# Patient Record
Sex: Female | Born: 1970 | Race: Black or African American | Hispanic: No | Marital: Single | Smoking: Former smoker
Health system: Southern US, Community
[De-identification: ages and names within clinical notes are randomized; demographics above are authoritative.]

## PROBLEM LIST (undated history)

## (undated) DIAGNOSIS — D649 Anemia, unspecified: Secondary | ICD-10-CM

## (undated) DIAGNOSIS — R011 Cardiac murmur, unspecified: Secondary | ICD-10-CM

## (undated) DIAGNOSIS — F32A Depression, unspecified: Secondary | ICD-10-CM

## (undated) DIAGNOSIS — M5481 Occipital neuralgia: Secondary | ICD-10-CM

## (undated) DIAGNOSIS — F419 Anxiety disorder, unspecified: Secondary | ICD-10-CM

## (undated) DIAGNOSIS — I10 Essential (primary) hypertension: Secondary | ICD-10-CM

## (undated) DIAGNOSIS — T7840XA Allergy, unspecified, initial encounter: Secondary | ICD-10-CM

## (undated) DIAGNOSIS — G709 Myoneural disorder, unspecified: Secondary | ICD-10-CM

## (undated) HISTORY — DX: Cardiac murmur, unspecified: R01.1

## (undated) HISTORY — DX: Anemia, unspecified: D64.9

## (undated) HISTORY — DX: Myoneural disorder, unspecified: G70.9

## (undated) HISTORY — PX: HERNIA REPAIR: SHX51

## (undated) HISTORY — PX: DILATION AND CURETTAGE OF UTERUS: SHX78

## (undated) HISTORY — DX: Allergy, unspecified, initial encounter: T78.40XA

## (undated) HISTORY — DX: Depression, unspecified: F32.A

## (undated) HISTORY — PX: BRAIN SURGERY: SHX531

---

## 2011-10-19 LAB — PROTIME-INR: INR: 1

## 2011-10-19 LAB — URINALYSIS, COMPLETE
Bacteria: NONE SEEN
Bilirubin,UR: NEGATIVE
Blood: NEGATIVE
Glucose,UR: NEGATIVE mg/dL (ref 0–75)
Ketone: NEGATIVE
Leukocyte Esterase: NEGATIVE
Nitrite: NEGATIVE
Ph: 6 (ref 4.5–8.0)
Protein: NEGATIVE
RBC,UR: <1 /HPF (ref 0–5)
Specific Gravity: 1.015 (ref 1.003–1.030)

## 2011-10-19 LAB — COMPREHENSIVE METABOLIC PANEL
Albumin: 4.6 g/dL (ref 3.4–5.0)
Alkaline Phosphatase: 49 U/L — ABNORMAL LOW (ref 50–136)
Anion Gap: 8 (ref 7–16)
BUN: 11 mg/dL (ref 7–18)
Calcium, Total: 9.2 mg/dL (ref 8.5–10.1)
Chloride: 104 mmol/L (ref 98–107)
EGFR (African American): >60
Glucose: 82 mg/dL (ref 65–99)
Potassium: 4.1 mmol/L (ref 3.5–5.1)
SGOT(AST): 17 U/L (ref 15–37)
Total Protein: 8.4 g/dL — ABNORMAL HIGH (ref 6.4–8.2)

## 2011-10-19 LAB — CBC
HCT: 37.1 % (ref 35.0–47.0)
HGB: 11.9 g/dL — ABNORMAL LOW (ref 12.0–16.0)
MCHC: 32.1 g/dL (ref 32.0–36.0)
MCV: 89 fL (ref 80–100)
Platelet: 278 10*3/uL (ref 150–440)
RBC: 4.17 10*6/uL (ref 3.80–5.20)
WBC: 5.8 10*3/uL (ref 3.6–11.0)

## 2011-10-20 ENCOUNTER — Observation Stay: Payer: Self-pay | Admitting: Surgery

## 2014-10-08 ENCOUNTER — Emergency Department: Payer: Self-pay | Admitting: Emergency Medicine

## 2015-01-27 NOTE — H&P (Signed)
PATIENT NAME:  Lisa Hurst, Lisa Hurst MR#:  [id number] DATE OF BIRTH:  1971-08-23  DATE OF ADMISSION: 10/19/2011  HISTORY OF PRESENT ILLNESS: Ms. Gibler is a 44 year old black female who has a known umbilical hernia that has been painful for the last couple of weeks but she noticed swelling and more severe pain at her umbilical hernia site 36 hours ago. Since that time her hernia has remained swollen and tender and unchanged. She has continued to eat normally and she had a normal bowel movement today and continues to pass flatus. She has not had any nausea, vomiting, or fever.   PAST MEDICAL HISTORY: No medical illnesses.   ALLERGIES: Azithromycin and apples both of which cause hives. Shellfish, which causes anaphylaxis.   MEDICATIONS: None.   REVIEW OF SYSTEMS: Negative for 10 systems except as mentioned in the history of present illness. The most pertinent negatives are lack of abdominal distention, nausea, vomiting, obstipation.   FAMILY HISTORY: Noncontributory.   SOCIAL HISTORY: The patient is single. She lives with her mother her sister and her child. She works at [employer] and does not smoke cigarettes or drink alcohol.   PHYSICAL EXAMINATION:  GENERAL: A pleasant, healthy, soft spoken young black female lying in the stretcher in the Emergency Room.   VITAL SIGNS: Height 5 feet 4 inches, weight 128 pounds, BMI 22.0. Temperature 98.3, pulse 75, respirations 20, blood pressure 143/93, oxygen saturation 100% on room air.   HEENT: Pupils are equally round and reactive to light. Extraocular movements intact. Sclerae nonicteric. Oropharynx clear. Mucous membranes moist.   NECK: Supple with no lymphadenopathy. The trachea is midline and there is no jugular venous distention.   HEART: Regular rate and rhythm with no murmurs or rubs.   LUNGS: Clear to auscultation with normal respiratory effort bilaterally.   ABDOMEN: Soft, flat, nondistended and nontender with no masses with  the exception of the umbilicus. At the umbilicus there is a 2 cm firm, irreducible mass with some surrounding erythema. This is quite tender.   EXTREMITIES: No edema with normal capillary refill bilaterally.   NEUROLOGIC: Cranial nerves II through XII, motor and sensation grossly intact.   PSYCHIATRIC: Alert and oriented x4. Appropriate affect.   LABORATORY, DIAGNOSTIC, AND RADIOLOGICAL DATA: White blood cell count 5.8, hemoglobin 11.9, hematocrit 37.1%, platelet count 278,000. Electrolytes normal. Lipase normal. Hepatic profile normal with the exception of an elevated total protein at 8.4. Urinalysis is negative. PT/INR is normal and urine pregnancy test is negative.   ASSESSMENT: Incarcerated umbilical hernia with no evidence of any intestinal involvement clinically.   PLAN: Admit to hospital for analgesia with incarcerated umbilical hernia repair.    ____________________________ Consuela Mimes, MD wfm:cms D: 10/19/2011 23:44:01 ET T: 10/20/2011 08:24:14 ET JOB#: [id number]  cc: Consuela Mimes, MD, <Dictator> Consuela Mimes MD ELECTRONICALLY SIGNED 10/21/2011 2:58

## 2015-01-27 NOTE — Op Note (Signed)
PATIENT NAME:  Lisa Hurst, Lisa Hurst MR#:  [id number] DATE OF BIRTH:  May 11, 1971  DATE OF PROCEDURE:  10/20/2011  PREOPERATIVE DIAGNOSIS: Incarcerated umbilical hernia.   POSTOPERATIVE DIAGNOSIS: Incarcerated umbilical hernia.   PROCEDURE PERFORMED: Repair of incarcerated umbilical hernia.   SURGEON: Lecretia Buczek A. Marina Gravel, MD    ASSISTANT: Alger Simons, CRT    TYPE OF ANESTHESIA: General.   INDICATIONS: The patient is a 44 year old female with a day and a half history of worsening abdominal pain centered around her umbilicus. She has a previous history of known umbilical hernia during her last pregnancy within the last two years. She has had no obstructive symptoms. Physical examination is consistent with an incarcerated small umbilical hernia exquisitely tender.   FINDINGS: Approximately 1 cm fascial defect. There was an incarcerated lobule of preperitoneal fat.   SPECIMENS: Hernia sac and contents.   ESTIMATED BLOOD LOSS: Minimal.   DESCRIPTION OF PROCEDURE: With the patient in the supine position, general anesthesia was induced. Her abdomen was widely prepped and draped with ChloraPrep solution followed by [place]. Time-out procedure was observed.   A transverse curvilinear infraumbilical skin incision was fashioned with a scalpel and carried down with electrocautery to the umbilical stalk. The umbilical stalk was encircled with blunt dissection. The hernia was mainly above the umbilicus. The umbilical dermis was detached from the hernia. The hernia sac was then resected off the fascial edges with cautery and submitted to pathology in formalin. The fascia was without any undue tension in its reapproximation in a transverse orientation in a pants-over-vest technique utilizing #0 Ethibond suture. The umbilical dermis was then reattached to the fascia with a figure-of-eight #3 0 Vicryl suture. The deep layer of the umbilical wound was then reapproximated utilizing interrupted 3-0 Vicryl sutures. Deep dermal  inverted 3-0 Vicryl sutures were placed followed by running 4-0 Vicryl subcuticular in the skin, Dermabond 4 x 4's, and tape. The patient tolerated the procedure well without immediate complication.  ____________________________ Jeannette How Marina Gravel, MD mab:drc D: 10/20/2011 10:59:05 ET T: 10/20/2011 12:40:01 ET JOB#: [id number]  cc: Elta Guadeloupe A. Marina Gravel, MD, <Dictator> Hortencia Conradi MD ELECTRONICALLY SIGNED 10/23/2011 11:17

## 2015-03-26 ENCOUNTER — Other Ambulatory Visit: Payer: Self-pay | Admitting: Obstetrics and Gynecology

## 2015-03-26 DIAGNOSIS — Z1231 Encounter for screening mammogram for malignant neoplasm of breast: Secondary | ICD-10-CM

## 2017-02-03 ENCOUNTER — Emergency Department
Admission: EM | Admit: 2017-02-03 | Discharge: 2017-02-03 | Disposition: A | Discharge status: Home / Self Care | Payer: Medicaid Other | Attending: Emergency Medicine | Admitting: Emergency Medicine

## 2017-02-03 ENCOUNTER — Encounter: Payer: Self-pay | Admitting: Emergency Medicine

## 2017-02-03 ENCOUNTER — Emergency Department: Payer: Medicaid Other

## 2017-02-03 DIAGNOSIS — R1031 Right lower quadrant pain: Secondary | ICD-10-CM

## 2017-02-03 DIAGNOSIS — R110 Nausea: Secondary | ICD-10-CM | POA: Diagnosis not present

## 2017-02-03 DIAGNOSIS — K229 Disease of esophagus, unspecified: Secondary | ICD-10-CM

## 2017-02-03 DIAGNOSIS — Z87891 Personal history of nicotine dependence: Secondary | ICD-10-CM | POA: Diagnosis not present

## 2017-02-03 DIAGNOSIS — K228 Other specified diseases of esophagus: Secondary | ICD-10-CM | POA: Insufficient documentation

## 2017-02-03 LAB — COMPREHENSIVE METABOLIC PANEL
ALBUMIN: 4.8 g/dL (ref 3.5–5.0)
ALT: 11 U/L — ABNORMAL LOW (ref 14–54)
ANION GAP: 6 (ref 5–15)
AST: 23 U/L (ref 15–41)
Alkaline Phosphatase: 40 U/L (ref 38–126)
BILIRUBIN TOTAL: 1 mg/dL (ref 0.3–1.2)
BUN: 11 mg/dL (ref 6–20)
CO2: 27 mmol/L (ref 22–32)
Calcium: 9.6 mg/dL (ref 8.9–10.3)
Chloride: 103 mmol/L (ref 101–111)
Creatinine, Ser: 0.99 mg/dL (ref 0.44–1.00)
GFR calc non Af Amer: >60 mL/min (ref >60)
GLUCOSE: 99 mg/dL (ref 65–99)
Potassium: 3.8 mmol/L (ref 3.5–5.1)
SODIUM: 136 mmol/L (ref 135–145)
TOTAL PROTEIN: 8.1 g/dL (ref 6.5–8.1)

## 2017-02-03 LAB — CBC
HEMATOCRIT: 39.4 % (ref 35.0–47.0)
HEMOGLOBIN: 13.1 g/dL (ref 12.0–16.0)
MCH: 29.5 pg (ref 26.0–34.0)
MCHC: 33.3 g/dL (ref 32.0–36.0)
MCV: 88.6 fL (ref 80.0–100.0)
Platelets: 277 10*3/uL (ref 150–440)
RBC: 4.45 MIL/uL (ref 3.80–5.20)
RDW: 13.5 % (ref 11.5–14.5)
WBC: 6.3 10*3/uL (ref 3.6–11.0)

## 2017-02-03 LAB — URINALYSIS, COMPLETE (UACMP) WITH MICROSCOPIC
BILIRUBIN URINE: NEGATIVE
GLUCOSE, UA: NEGATIVE mg/dL
HGB URINE DIPSTICK: NEGATIVE
KETONES UR: 5 mg/dL — AB
LEUKOCYTES UA: NEGATIVE
NITRITE: NEGATIVE
PH: 5 (ref 5.0–8.0)
PROTEIN: NEGATIVE mg/dL
Specific Gravity, Urine: 1.024 (ref 1.005–1.030)

## 2017-02-03 LAB — LIPASE, BLOOD: Lipase: 21 U/L (ref 11–51)

## 2017-02-03 LAB — POCT PREGNANCY, URINE: Preg Test, Ur: NEGATIVE

## 2017-02-03 MED ORDER — IOPAMIDOL (ISOVUE-300) INJECTION 61%
30.0000 mL | Freq: Once | INTRAVENOUS | Status: AC | PRN
Start: 2017-02-03 — End: 2017-02-03
  Administered 2017-02-03: 30 mL via ORAL

## 2017-02-03 MED ORDER — ONDANSETRON HCL 4 MG/2ML IJ SOLN
4.0000 mg | Freq: Once | INTRAMUSCULAR | Status: AC
  Administered 2017-02-03: 4 mg via INTRAVENOUS
  Filled 2017-02-03: qty 2

## 2017-02-03 MED ORDER — IOPAMIDOL (ISOVUE-300) INJECTION 61%
100.0000 mL | Freq: Once | INTRAVENOUS | Status: AC | PRN
  Administered 2017-02-03: 100 mL via INTRAVENOUS

## 2017-02-03 MED ORDER — SODIUM CHLORIDE 0.9 % IV BOLUS (SEPSIS)
1000.0000 mL | Freq: Once | INTRAVENOUS | Status: AC
  Administered 2017-02-03: 1000 mL via INTRAVENOUS

## 2017-02-03 MED ORDER — ONDANSETRON 4 MG PO TBDP: 4.0000 mg | ORAL_TABLET | Freq: Three times a day (TID) | ORAL | 0 refills | Status: DC | PRN

## 2017-02-03 NOTE — Discharge Instructions (Signed)
You may take Tylenol or Motrin for pain. Zofran as for nausea. Please take a bland diet, until you're able to tolerate and normal diet.  Today, there was an abnormal lesion found near your esophagus. It is unlikely to be worrisome, but will need to be readmitted. Please ask your regular doctor to order repeat testing.  Return to the emergency department if you develop severe pain, fever, inability to keep down fluids, or any other symptoms concerning to you.

## 2017-02-03 NOTE — ED Notes (Signed)
Pt continues to drink contrast. Pt aware to press call bell when finished. Pt up to bathroom. NAD at this time. Ambulatory without assistance.

## 2017-02-03 NOTE — ED Triage Notes (Signed)
R lower abd pain x 3 days, loss of appetitie, denies fevers.

## 2017-02-03 NOTE — ED Provider Notes (Signed)
[facility] Emergency Department Provider Note  ____________________________________________  Time seen: Approximately 10:58 AM  I have reviewed the triage vital signs and the nursing notes.   HISTORY  Chief Complaint Abdominal Pain   HPI Lisa Hurst is a 46 y.o. female with a history of abdominal hernia repair remotely presenting with right lower quadrant pain and nausea. The patient reports that over last 3 days, she has had of fairly constant dull ache in the right lower quadrant and occasionally sharp pains are the sharp pains are more likely to occur she's been sitting for a while and then stands up. Her pain is worse with walking. She has had associated nausea without vomiting. Last BM was 2 days ago and was normal. No fevers or chills. No change in vaginal discharge or dysuria.  SH: Sexual active with one female partner, uses condoms every time for contraception.   History reviewed. No pertinent past medical history.  There are no active problems to display for this patient.   Past Surgical History:  Procedure Laterality Date  . DILATION AND CURETTAGE OF UTERUS    . HERNIA REPAIR        Allergies Erythromycin  No family history on file.  Social History Social History  Substance Use Topics  . Smoking status: Former Research scientist (life sciences)  . Smokeless tobacco: Not on file  . Alcohol use Not on file    Review of Systems Constitutional: No fever/chills.No lightheadedness or syncope. Eyes: No visual changes. ENT: No sore throat. No congestion or rhinorrhea. Cardiovascular: Denies chest pain. Denies palpitations. Respiratory: Denies shortness of breath.  No cough. Gastrointestinal: Positive right lower quadrant abdominal pain.  Positive nausea, no vomiting.  No diarrhea.  No constipation. Genitourinary: Negative for dysuria. No urinary frequency. No change in vaginal discharge. Musculoskeletal: Negative for back pain. Skin: Negative for  rash. Neurological: Negative for headaches. No focal numbness, tingling or weakness.   10-point ROS otherwise negative.  ____________________________________________   PHYSICAL EXAM:  VITAL SIGNS: ED Triage Vitals  Enc Vitals Group     BP 02/03/17 0938 (!) 127/93     Pulse Rate 02/03/17 0938 96     Resp 02/03/17 0938 18     Temp 02/03/17 0938 97.9 F (36.6 C)     Temp Source 02/03/17 0938 Oral     SpO2 02/03/17 0938 100 %     Weight 02/03/17 0939 138 lb (62.6 kg)     Height 02/03/17 0939 5\' 4"  (1.626 m)     Head Circumference --      Peak Flow --      Pain Score 02/03/17 0938 5     Pain Loc --      Pain Edu? --      Excl. in [place]? --     Constitutional: Alert and oriented. Well appearing and in no acute distress. Answers questions appropriately. Eyes: Conjunctivae are normal.  EOMI. No scleral icterus. Head: Atraumatic. Nose: No congestion/rhinnorhea. Mouth/Throat: Mucous membranes are moist.  Neck: No stridor.  Supple.   Cardiovascular: Normal rate, regular rhythm. No murmurs, rubs or gallops.  Respiratory: Normal respiratory effort.  No accessory muscle use or retractions. Lungs CTAB.  No wheezes, rales or ronchi. Gastrointestinal: Soft, and nondistended.  Significant tenderness to palpation in the lateral superior portion of the right lower quadrant. No guarding or rebound.  No peritoneal signs. Genitourinary: Deferred as the patient is not having lower pelvic pain or vaginal complaints today. Musculoskeletal: No LE edema.  Neurologic:  A&Ox3.  Speech is clear.  Face and smile are symmetric.  EOMI.  Moves all extremities well. Skin:  Skin is warm, dry and intact. No rash noted. Psychiatric: Mood and affect are normal. Speech and behavior are normal.  Normal judgement.  ____________________________________________   LABS (all labs ordered are listed, but only abnormal results are displayed)  Labs Reviewed  COMPREHENSIVE METABOLIC PANEL - Abnormal; Notable for the  following:       Result Value   ALT 11 (*)    All other components within normal limits  URINALYSIS, COMPLETE (UACMP) WITH MICROSCOPIC - Abnormal; Notable for the following:    Color, Urine YELLOW (*)    APPearance HAZY (*)    Ketones, ur 5 (*)    Bacteria, UA RARE (*)    Squamous Epithelial / LPF 0-5 (*)    All other components within normal limits  LIPASE, BLOOD  CBC  POC URINE PREG, ED  POCT PREGNANCY, URINE   ____________________________________________  EKG  Not indicated ____________________________________________  RADIOLOGY  Ct Abdomen Pelvis W Contrast  Result Date: 02/03/2017 CLINICAL DATA:  RIGHT-sided abdominal pain. Nausea. Lack of appetite. EXAM: CT ABDOMEN AND PELVIS WITH CONTRAST TECHNIQUE: Multidetector CT imaging of the abdomen and pelvis was performed using the standard protocol following bolus administration of intravenous contrast. CONTRAST:  182mL ISOVUE-300 IOPAMIDOL (ISOVUE-300) INJECTION 61% COMPARISON:  None. FINDINGS: Lower chest: Lung bases are clear. Hepatobiliary: Periportal edema is present. No focal hepatic lesion. Portal veins are patent. No biliary duct dilatation. Gallbladder collapsed. Common bile duct normal caliber. Pancreas: Pancreas is normal. No ductal dilatation. No pancreatic inflammation. Spleen: Normal spleen Adrenals/urinary tract: Adrenal glands and kidneys are normal. The ureters and bladder normal. Stomach/Bowel: Stomach, small bowel, appendix, and cecum are normal. The colon and rectosigmoid colon are normal. Vascular/Lymphatic: Abdominal aorta is normal caliber. There is no retroperitoneal or periportal lymphadenopathy. No pelvic lymphadenopathy. Reproductive: Uterus and ovaries normal. Other: Rounded soft tissue density positioned along the distal esophagus and anterior to the aorta measures 32 x 32 mm. Musculoskeletal: No aggressive osseous lesion. IMPRESSION: 1. Periportal edema in the liver could indicate volume overload from IV  hydration. 2. Normal appendix. 3. No obstructive uropathy. 4. No abnormality of the bowel. 5. Uterus and ovaries appear normal. 6. Rounded lesion along the LEFT aspect of the distal esophagus is indeterminate. This may represent a bronchogenic cyst or enteric duplication cyst. Lesion does not have worrisome characteristics however recommend follow-up nonemergent MRI or CT of the chest for further evaluation. Electronically Signed   By: Suzy Bouchard M.D.   On: 02/03/2017 13:10    ____________________________________________   PROCEDURES  Procedure(s) performed: None  Procedures  Critical Care performed: No ____________________________________________   INITIAL IMPRESSION / ASSESSMENT AND PLAN / ED COURSE  Pertinent labs & imaging results that were available during my care of the patient were reviewed by me and considered in my medical decision making (see chart for details).  46 y.o. female with a history of remote abdominal wall hernia repair presenting with right lower quadrant pain with nausea. Overall, the patient is well-appearing, afebrile and has stable vital signs. However, her abdominal examination is concerning for tenderness in the right lower quadrant. We'll get a CT scan to evaluate for appendicitis. If this is negative, I'll reevaluate her and consider ovarian pathology although ovarian torsion is lower on the differential. Plan reevaluation for final disposition.  ----------------------------------------- 1:53 PM on 02/03/2017 -----------------------------------------  The patient's workup in the emergency department has been reassuring. She  is not pregnant, and her laboratory studies are reassuring. She does not have a UTI. CT scan does not show appendicitis or any other acute causes for the patient's symptoms. There is an esophageal lesion on her CT scan that will need outpatient reevaluation, and I have let the patient know about this finding. At this time, the patient  is safe for discharge. Return precautions as well as follow-up instructions were discussed.  ____________________________________________  FINAL CLINICAL IMPRESSION(S) / ED DIAGNOSES  Final diagnoses:  Esophageal lesion  Right lower quadrant pain  Nausea without vomiting         NEW MEDICATIONS STARTED DURING THIS VISIT:  New Prescriptions   ONDANSETRON (ZOFRAN ODT) 4 MG DISINTEGRATING TABLET    Take 1 tablet (4 mg total) by mouth every 8 (eight) hours as needed for nausea or vomiting.      Eula Listen, MD 02/03/17 1354

## 2017-02-03 NOTE — ED Notes (Signed)
Pt has finished oral contrast. RN called CT.

## 2017-02-03 NOTE — ED Notes (Signed)
Pt returned from CT. NAD noted. Pt resting in bed at this time.

## 2017-02-03 NOTE — ED Notes (Signed)
Pt states Sunday she developed right sided abd pain with nausea and lack of appetite, states when pain began she became very sweaty, last BM 4/30, denies any problems with urination, denies any vaginal discharge, awake and alert in no acute distress

## 2017-02-03 NOTE — ED Notes (Signed)
Pt resting in bed, resp even and unlabored 

## 2017-02-03 NOTE — ED Notes (Signed)
Patient transported to CT 

## 2018-04-20 ENCOUNTER — Emergency Department: Payer: Medicaid Other

## 2018-04-20 ENCOUNTER — Emergency Department
Admission: EM | Admit: 2018-04-20 | Discharge: 2018-04-20 | Disposition: A | Discharge status: Home / Self Care | Payer: Medicaid Other | Attending: Emergency Medicine | Admitting: Emergency Medicine

## 2018-04-20 ENCOUNTER — Encounter: Payer: Self-pay | Admitting: Emergency Medicine

## 2018-04-20 DIAGNOSIS — Z87891 Personal history of nicotine dependence: Secondary | ICD-10-CM | POA: Diagnosis not present

## 2018-04-20 DIAGNOSIS — J384 Edema of larynx: Secondary | ICD-10-CM | POA: Diagnosis not present

## 2018-04-20 DIAGNOSIS — J040 Acute laryngitis: Secondary | ICD-10-CM | POA: Diagnosis not present

## 2018-04-20 DIAGNOSIS — E049 Nontoxic goiter, unspecified: Secondary | ICD-10-CM

## 2018-04-20 DIAGNOSIS — J029 Acute pharyngitis, unspecified: Secondary | ICD-10-CM | POA: Diagnosis present

## 2018-04-20 MED ORDER — METHYLPREDNISOLONE 4 MG PO TBPK: ORAL_TABLET | ORAL | 0 refills | Status: DC

## 2018-04-20 NOTE — ED Provider Notes (Signed)
[facility] Emergency Department Provider Note   ____________________________________________   First MD Initiated Contact with Patient 04/20/18 1246     (approximate)  I have reviewed the triage vital signs and the nursing notes.   HISTORY  Chief Complaint Sore Throat    HPI Lisa Hurst is a 47 y.o. female patient complain anterior throat pain which she describes "pressure".  Patient able to tolerate food and fluids.  Patient recently recovered from viral upper respiratory infection.  Patient rates her discomfort as a 4/10.  No palliative measure for complaint.  History reviewed. No pertinent past medical history.  There are no active problems to display for this patient.   Past Surgical History:  Procedure Laterality Date  . DILATION AND CURETTAGE OF UTERUS    . HERNIA REPAIR      Prior to Admission medications   Medication Sig Start Date End Date Taking? Authorizing Provider  methylPREDNISolone (MEDROL DOSEPAK) 4 MG TBPK tablet Take Tapered dose as directed 04/20/18   Sable Feil, PA-C  ondansetron (ZOFRAN ODT) 4 MG disintegrating tablet Take 1 tablet (4 mg total) by mouth every 8 (eight) hours as needed for nausea or vomiting. 02/03/17   Eula Listen, MD    Allergies Erythromycin  No family history on file.  Social History Social History   Tobacco Use  . Smoking status: Former Smoker  Substance Use Topics  . Alcohol use: Not on file  . Drug use: Not on file    Review of Systems Constitutional: No fever/chills Eyes: No visual changes. ENT: sore throat. Cardiovascular: Denies chest pain. Respiratory: Denies shortness of breath. Gastrointestinal: No abdominal pain.  No nausea, no vomiting.  No diarrhea.  No constipation. Genitourinary: Negative for dysuria. Musculoskeletal: Negative for back pain. Skin: Negative for rash. Neurological: Negative for headaches, focal weakness or  numbness.   ____________________________________________   PHYSICAL EXAM:  VITAL SIGNS: ED Triage Vitals  Enc Vitals Group     BP 04/20/18 1253 (!) 161/91     Pulse Rate 04/20/18 1253 76     Resp 04/20/18 1253 16     Temp 04/20/18 1253 98.7 F (37.1 C)     Temp Source 04/20/18 1253 Oral     SpO2 04/20/18 1253 100 %     Weight 04/20/18 1237 142 lb (64.4 kg)     Height 04/20/18 1237 5\' 4"  (1.626 m)     Head Circumference --      Peak Flow --      Pain Score 04/20/18 1237 4     Pain Loc --      Pain Edu? --      Excl. in [place]? --    Constitutional: Alert and oriented. Well appearing and in no acute distress. Eyes: Conjunctivae are normal. PERRL. EOMI. Head: Atraumatic. Nose: No congestion/rhinnorhea. Mouth/Throat: Mucous membranes are moist.  Oropharynx non-erythematous. Neck: No stridor.   Hematological/Lymphatic/Immunilogical: No cervical lymphadenopathy. Cardiovascular: Normal rate, regular rhythm. Grossly normal heart sounds.  Good peripheral circulation. Respiratory: Normal respiratory effort.  No retractions. Lungs CTAB. Neurologic:  Normal speech and language. No gross focal neurologic deficits are appreciated. No gait instability. Skin:  Skin is warm, dry and intact. No rash noted. Psychiatric: Mood and affect are normal. Speech and behavior are normal.  ____________________________________________   LABS (all labs ordered are listed, but only abnormal results are displayed)  Labs Reviewed - No data to display ____________________________________________  EKG   ____________________________________________  RADIOLOGY  ED MD interpretation:  Official radiology report(s): Dg Neck Soft Tissue  Result Date: 04/20/2018 CLINICAL DATA:  Pt reports had cold sxs and all got better except her throat still hurts. Denies fevers or difficulty swallowing but reports feels that something is pressing against her neck; States she feels like her neck is swollen; shielded  EXAM: NECK SOFT TISSUES - 1+ VIEW COMPARISON:  None. FINDINGS: There is no evidence of retropharyngeal soft tissue swelling or epiglottic enlargement. The cervical airway is unremarkable and no radio-opaque foreign body identified. Missing teeth and restorations. IMPRESSION: Negative. Electronically Signed   By: Lucrezia Europe M.D.   On: 04/20/2018 14:09    ____________________________________________   PROCEDURES  Procedure(s) performed: None  Procedures  Critical Care performed: No  ____________________________________________   INITIAL IMPRESSION / ASSESSMENT AND PLAN / ED COURSE  As part of my medical decision making, I reviewed the following data within the electronic MEDICAL RECORD NUMBER    Sore throat secondary to inflammation.  Discussed negative findings on soft tissue neck.  Patient given discharge care instruction advised take medication as directed.  Patient advised follow-up with the ENT clinic if no improvement in 1 week.      ____________________________________________   FINAL CLINICAL IMPRESSION(S) / ED DIAGNOSES  Final diagnoses:  Edema glottis  Inflammation of subglottic region     ED Discharge Orders        Ordered    methylPREDNISolone (MEDROL DOSEPAK) 4 MG TBPK tablet     04/20/18 1440       Note:  This document was prepared using Dragon voice recognition software and may include unintentional dictation errors.    Sable Feil, PA-C 04/20/18 1443    Delman Kitten, MD 04/20/18 [phone]

## 2018-04-20 NOTE — Discharge Instructions (Signed)
Take medication as directed.  If no improvement 1 week follow-up with the [facility] ENT clinic.

## 2018-04-20 NOTE — ED Triage Notes (Signed)
Pt reports had cold sxs and all got better except her throat still hurts. Denies fevers or difficulty swallowing but reports feels that something is pressing.

## 2018-09-13 ENCOUNTER — Other Ambulatory Visit: Payer: Self-pay

## 2018-09-13 ENCOUNTER — Emergency Department: Payer: Medicaid Other

## 2018-09-13 ENCOUNTER — Emergency Department
Admission: EM | Admit: 2018-09-13 | Discharge: 2018-09-13 | Disposition: A | Discharge status: Home / Self Care | Payer: Medicaid Other | Attending: Emergency Medicine | Admitting: Emergency Medicine

## 2018-09-13 ENCOUNTER — Encounter: Payer: Self-pay | Admitting: Emergency Medicine

## 2018-09-13 DIAGNOSIS — Z87891 Personal history of nicotine dependence: Secondary | ICD-10-CM | POA: Insufficient documentation

## 2018-09-13 DIAGNOSIS — S99922A Unspecified injury of left foot, initial encounter: Secondary | ICD-10-CM | POA: Diagnosis present

## 2018-09-13 DIAGNOSIS — S92522A Displaced fracture of medial phalanx of left lesser toe(s), initial encounter for closed fracture: Secondary | ICD-10-CM | POA: Insufficient documentation

## 2018-09-13 DIAGNOSIS — Y92019 Unspecified place in single-family (private) house as the place of occurrence of the external cause: Secondary | ICD-10-CM | POA: Insufficient documentation

## 2018-09-13 DIAGNOSIS — Y9301 Activity, walking, marching and hiking: Secondary | ICD-10-CM | POA: Insufficient documentation

## 2018-09-13 DIAGNOSIS — W231XXA Caught, crushed, jammed, or pinched between stationary objects, initial encounter: Secondary | ICD-10-CM | POA: Insufficient documentation

## 2018-09-13 DIAGNOSIS — Y998 Other external cause status: Secondary | ICD-10-CM | POA: Diagnosis not present

## 2018-09-13 MED ORDER — IBUPROFEN 600 MG PO TABS: 600.0000 mg | ORAL_TABLET | Freq: Four times a day (QID) | ORAL | 0 refills | Status: DC | PRN

## 2018-09-13 NOTE — ED Provider Notes (Signed)
[facility] Emergency Department Provider Note  ____________________________________________  Time seen: Approximately 7:08 PM  I have reviewed the triage vital signs and the nursing notes.   HISTORY  Chief Complaint Toe Pain    HPI Lisa Hurst is a 47 y.o. female presents emergency department for evaluation of left little toe pain after injury yesterday morning.  Patient states that her toe got jammed between her slipper and the wall.  She has had pain with walking on it all day today.  No additional injuries.  History reviewed. No pertinent past medical history.  There are no active problems to display for this patient.   Past Surgical History:  Procedure Laterality Date  . DILATION AND CURETTAGE OF UTERUS    . HERNIA REPAIR      Prior to Admission medications   Medication Sig Start Date End Date Taking? Authorizing Provider  ibuprofen (ADVIL,MOTRIN) 600 MG tablet Take 1 tablet (600 mg total) by mouth every 6 (six) hours as needed. 09/13/18   Laban Emperor, PA-C  methylPREDNISolone (MEDROL DOSEPAK) 4 MG TBPK tablet Take Tapered dose as directed 04/20/18   Sable Feil, PA-C  ondansetron (ZOFRAN ODT) 4 MG disintegrating tablet Take 1 tablet (4 mg total) by mouth every 8 (eight) hours as needed for nausea or vomiting. 02/03/17   Eula Listen, MD    Allergies Erythromycin  No family history on file.  Social History Social History   Tobacco Use  . Smoking status: Former Research scientist (life sciences)  . Smokeless tobacco: Never Used  Substance Use Topics  . Alcohol use: Not on file  . Drug use: Not on file     Review of Systems  Cardiovascular: No chest pain. Respiratory: No SOB. Gastrointestinal: No nausea, no vomiting.  Musculoskeletal: Positive for toe pain Skin: Negative for rash, abrasions, lacerations, ecchymosis. Neurological: Negative for headaches, numbness or tingling   ____________________________________________   PHYSICAL  EXAM:  VITAL SIGNS: ED Triage Vitals  Enc Vitals Group     BP 09/13/18 1757 (!) 150/97     Pulse Rate 09/13/18 1757 69     Resp 09/13/18 1757 20     Temp 09/13/18 1757 98.5 F (36.9 C)     Temp Source 09/13/18 1757 Oral     SpO2 09/13/18 1757 100 %     Weight 09/13/18 1753 138 lb (62.6 kg)     Height 09/13/18 1753 5\' 4"  (1.626 m)     Head Circumference --      Peak Flow --      Pain Score 09/13/18 1757 7     Pain Loc --      Pain Edu? --      Excl. in [place]? --      Constitutional: Alert and oriented. Well appearing and in no acute distress. Eyes: Conjunctivae are normal. PERRL. EOMI. Head: Atraumatic. ENT:      Ears:      Nose: No congestion/rhinnorhea.      Mouth/Throat: Mucous membranes are moist.  Neck: No stridor.   Cardiovascular: Normal rate, regular rhythm.  Good peripheral circulation. Respiratory: Normal respiratory effort without tachypnea or retractions. Lungs CTAB. Good air entry to the bases with no decreased or absent breath sounds. Musculoskeletal: Full range of motion to all extremities. No gross deformities appreciated.  Mild swelling and tenderness to palpation to left little toe. Neurologic:  Normal speech and language. No gross focal neurologic deficits are appreciated.  Skin:  Skin is warm, dry and intact. No rash noted. Psychiatric:  Mood and affect are normal. Speech and behavior are normal. Patient exhibits appropriate insight and judgement.   ____________________________________________   LABS (all labs ordered are listed, but only abnormal results are displayed)  Labs Reviewed - No data to display ____________________________________________  EKG   ____________________________________________  RADIOLOGY Robinette Haines, personally viewed and evaluated these images (plain radiographs) as part of my medical decision making, as well as reviewing the written report by the radiologist.  Dg Foot Complete Left  Result Date:  09/13/2018 CLINICAL DATA:  Hit foot against solid object EXAM: LEFT FOOT - COMPLETE 3+ VIEW COMPARISON:  None. FINDINGS: Frontal, oblique, and lateral views were obtained. There is a mildly comminuted fracture of the proximal aspect of the fifth middle phalanx with fracture fragments in near anatomic alignment. No other fractures are evident. No dislocation. Joint spaces appear normal. No erosive change. IMPRESSION: Mildly comminuted fracture proximal aspect fifth middle phalanx with fracture fragments in near anatomic alignment. No other fracture. No dislocation. No evident arthropathy. Electronically Signed   By: Lowella Grip III M.D.   On: 09/13/2018 18:50    ____________________________________________    PROCEDURES  Procedure(s) performed:    Procedures    Medications - No data to display   ____________________________________________   INITIAL IMPRESSION / ASSESSMENT AND PLAN / ED COURSE  Pertinent labs & imaging results that were available during my care of the patient were reviewed by me and considered in my medical decision making (see chart for details).  Review of the  [place]RS was performed in accordance of the [place] prior to dispensing any controlled drugs.     Patient presented emergency department for evaluation of toe injury yesterday.  Vital signs and exam are reassuring.  X-ray consistent with fracture.  Toes were buddy taped and postop shoe was given.  Patient does not want any crutches.  Patient will be discharged home with prescriptions for ibuprofen. Patient is to follow up with podiatry as directed. Patient is given ED precautions to return to the ED for any worsening or new symptoms.     ____________________________________________  FINAL CLINICAL IMPRESSION(S) / ED DIAGNOSES  Final diagnoses:  Closed displaced fracture of middle phalanx of lesser toe of left foot, initial encounter      NEW MEDICATIONS STARTED DURING THIS VISIT:  ED  Discharge Orders         Ordered    ibuprofen (ADVIL,MOTRIN) 600 MG tablet  Every 6 hours PRN     09/13/18 1918              This chart was dictated using voice recognition software/Dragon. Despite best efforts to proofread, errors can occur which can change the meaning. Any change was purely unintentional.    Laban Emperor, PA-C 09/13/18 2034    Carrie Mew, MD 09/13/18 [id number]

## 2018-09-13 NOTE — ED Triage Notes (Signed)
States she hit her left 5 th toe yesterday  Having increased pain this afternoon

## 2019-11-17 ENCOUNTER — Emergency Department: Payer: BC Managed Care – PPO

## 2019-11-17 ENCOUNTER — Encounter: Payer: Self-pay | Admitting: Radiology

## 2019-11-17 ENCOUNTER — Other Ambulatory Visit: Payer: Self-pay

## 2019-11-17 ENCOUNTER — Emergency Department
Admission: EM | Admit: 2019-11-17 | Discharge: 2019-11-17 | Disposition: A | Discharge status: Home / Self Care | Payer: BC Managed Care – PPO | Attending: Emergency Medicine | Admitting: Emergency Medicine

## 2019-11-17 DIAGNOSIS — R0602 Shortness of breath: Secondary | ICD-10-CM | POA: Diagnosis not present

## 2019-11-17 DIAGNOSIS — Z87891 Personal history of nicotine dependence: Secondary | ICD-10-CM | POA: Diagnosis not present

## 2019-11-17 DIAGNOSIS — Z20822 Contact with and (suspected) exposure to covid-19: Secondary | ICD-10-CM | POA: Insufficient documentation

## 2019-11-17 DIAGNOSIS — R002 Palpitations: Secondary | ICD-10-CM | POA: Insufficient documentation

## 2019-11-17 DIAGNOSIS — R0789 Other chest pain: Secondary | ICD-10-CM | POA: Diagnosis not present

## 2019-11-17 LAB — COMPREHENSIVE METABOLIC PANEL
ALT: 16 U/L (ref 0–44)
AST: 24 U/L (ref 15–41)
Albumin: 4.9 g/dL (ref 3.5–5.0)
Alkaline Phosphatase: 39 U/L (ref 38–126)
Anion gap: 11 (ref 5–15)
BUN: 19 mg/dL (ref 6–20)
CO2: 22 mmol/L (ref 22–32)
Calcium: 9.8 mg/dL (ref 8.9–10.3)
Chloride: 105 mmol/L (ref 98–111)
Creatinine, Ser: 0.97 mg/dL (ref 0.44–1.00)
GFR calc Af Amer: >60 mL/min (ref >60)
GFR calc non Af Amer: >60 mL/min (ref >60)
Glucose, Bld: 78 mg/dL (ref 70–99)
Potassium: 4.2 mmol/L (ref 3.5–5.1)
Sodium: 138 mmol/L (ref 135–145)
Total Bilirubin: 1.2 mg/dL (ref 0.3–1.2)
Total Protein: 8.4 g/dL — ABNORMAL HIGH (ref 6.5–8.1)

## 2019-11-17 LAB — CBC WITH DIFFERENTIAL/PLATELET
Abs Immature Granulocytes: 0.02 10*3/uL (ref 0.00–0.07)
Basophils Absolute: 0 10*3/uL (ref 0.0–0.1)
Basophils Relative: 1 %
Eosinophils Absolute: 0.1 10*3/uL (ref 0.0–0.5)
Eosinophils Relative: 1 %
HCT: 41.3 % (ref 36.0–46.0)
Hemoglobin: 13.2 g/dL (ref 12.0–15.0)
Immature Granulocytes: 0 %
Lymphocytes Relative: 26 %
Lymphs Abs: 1.5 10*3/uL (ref 0.7–4.0)
MCH: 28.7 pg (ref 26.0–34.0)
MCHC: 32 g/dL (ref 30.0–36.0)
MCV: 89.8 fL (ref 80.0–100.0)
Monocytes Absolute: 0.4 10*3/uL (ref 0.1–1.0)
Monocytes Relative: 6 %
Neutro Abs: 3.9 10*3/uL (ref 1.7–7.7)
Neutrophils Relative %: 66 %
Platelets: 265 10*3/uL (ref 150–400)
RBC: 4.6 MIL/uL (ref 3.87–5.11)
RDW: 12.4 % (ref 11.5–15.5)
WBC: 5.9 10*3/uL (ref 4.0–10.5)
nRBC: 0 % (ref 0.0–0.2)

## 2019-11-17 LAB — TSH: TSH: 0.92 u[IU]/mL (ref 0.350–4.500)

## 2019-11-17 LAB — TROPONIN I (HIGH SENSITIVITY): Troponin I (High Sensitivity): <2 ng/L (ref <18)

## 2019-11-17 LAB — T4, FREE: Free T4: 1.05 ng/dL (ref 0.61–1.12)

## 2019-11-17 LAB — FIBRIN DERIVATIVES D-DIMER (ARMC ONLY): Fibrin derivatives D-dimer (ARMC): 3336.86 ng/mL (FEU) — ABNORMAL HIGH (ref 0.00–499.00)

## 2019-11-17 MED ORDER — IOHEXOL 350 MG/ML SOLN
75.0000 mL | Freq: Once | INTRAVENOUS | Status: AC | PRN
  Administered 2019-11-17: 75 mL via INTRAVENOUS

## 2019-11-17 NOTE — ED Notes (Signed)
MD at bedside. 

## 2019-11-17 NOTE — ED Triage Notes (Signed)
Pt states that she started having chest pain 4 days ago, pt states that the pain has been intermittent then notices that she has become sob and her heart is racing, pt states that the pain feels as if something is pulling back at her left chest area. No distress noted at this time, pt reports driving herself

## 2019-11-17 NOTE — ED Notes (Signed)
Pt to CT at this time.

## 2019-11-17 NOTE — ED Provider Notes (Signed)
[facility] Emergency Department Provider Note  ____________________________________________  Time seen: Approximately 5:20 PM  I have reviewed the triage vital signs and the nursing notes.   HISTORY  Chief Complaint Chest Pain    HPI Lynsy Prodoehl is a 49 y.o. female with no significant past medical history who complains of chest pain for the past 4 days, intermittent, no aggravating or alleviating factors, last for a few minutes at a time.  Nonradiating.  Feels like squeezing.  Associated with shortness of breath and a feeling of heart racing without dizziness or syncope.  Not exertional, not pleuritic.      History reviewed. No pertinent past medical history.   There are no problems to display for this patient.    Past Surgical History:  Procedure Laterality Date  . DILATION AND CURETTAGE OF UTERUS    . HERNIA REPAIR       Prior to Admission medications   Medication Sig Start Date End Date Taking? Authorizing Provider  ibuprofen (ADVIL,MOTRIN) 600 MG tablet Take 1 tablet (600 mg total) by mouth every 6 (six) hours as needed. 09/13/18   Laban Emperor, PA-C  methylPREDNISolone (MEDROL DOSEPAK) 4 MG TBPK tablet Take Tapered dose as directed 04/20/18   Sable Feil, PA-C  ondansetron (ZOFRAN ODT) 4 MG disintegrating tablet Take 1 tablet (4 mg total) by mouth every 8 (eight) hours as needed for nausea or vomiting. 02/03/17   Eula Listen, MD     Allergies Erythromycin   No family history on file.  Social History Social History   Tobacco Use  . Smoking status: Former Research scientist (life sciences)  . Smokeless tobacco: Never Used  Substance Use Topics  . Alcohol use: Not on file  . Drug use: Not on file    Review of Systems  Constitutional:   No fever or chills.  ENT:   No sore throat. No rhinorrhea. Cardiovascular:   Positive as above chest pain without syncope. Respiratory:   Positive shortness of breath without cough. Gastrointestinal:    Negative for abdominal pain, vomiting and diarrhea.  Musculoskeletal:   Negative for focal pain or swelling All other systems reviewed and are negative except as documented above in ROS and HPI.  ____________________________________________   PHYSICAL EXAM:  VITAL SIGNS: ED Triage Vitals  Enc Vitals Group     BP 11/17/19 1037 138/90     Pulse Rate 11/17/19 1037 77     Resp 11/17/19 1037 18     Temp 11/17/19 1037 98.4 F (36.9 C)     Temp Source 11/17/19 1037 Oral     SpO2 11/17/19 1037 100 %     Weight 11/17/19 1038 138 lb (62.6 kg)     Height 11/17/19 1038 5\' 4"  (1.626 m)     Head Circumference --      Peak Flow --      Pain Score 11/17/19 1038 3     Pain Loc --      Pain Edu? --      Excl. in [place]? --     Vital signs reviewed, nursing assessments reviewed.   Constitutional:   Alert and oriented. Non-toxic appearance. Eyes:   Conjunctivae are normal. EOMI. PERRL. ENT      Head:   Normocephalic and atraumatic.      Nose:   Wearing a mask.      Mouth/Throat:   Wearing a mask.      Neck:   No meningismus. Full ROM. Hematological/Lymphatic/Immunilogical:   No cervical lymphadenopathy.  Cardiovascular:   RRR. Symmetric bilateral radial and DP pulses.  No murmurs. Cap refill less than 2 seconds. Respiratory:   Normal respiratory effort without tachypnea/retractions. Breath sounds are clear and equal bilaterally. No wheezes/rales/rhonchi. Gastrointestinal:   Soft and nontender. Non distended. There is no CVA tenderness.  No rebound, rigidity, or guarding. Musculoskeletal:   Normal range of motion in all extremities. No joint effusions.  No lower extremity tenderness.  No edema. Neurologic:   Normal speech and language.  Motor grossly intact. No acute focal neurologic deficits are appreciated.  Skin:    Skin is warm, dry and intact. No rash noted.  No petechiae, purpura, or bullae.  ____________________________________________    LABS (pertinent positives/negatives) (all  labs ordered are listed, but only abnormal results are displayed) Labs Reviewed  COMPREHENSIVE METABOLIC PANEL - Abnormal; Notable for the following components:      Result Value   Total Protein 8.4 (*)    All other components within normal limits  FIBRIN DERIVATIVES D-DIMER ([facility] ONLY) - Abnormal; Notable for the following components:   Fibrin derivatives D-dimer [facility]) [id number] (*)    All other components within normal limits  SARS CORONAVIRUS 2 (TAT 6-24 HRS)  CBC WITH DIFFERENTIAL/PLATELET  TSH  T4, FREE  POC URINE PREG, ED  TROPONIN I (HIGH SENSITIVITY)   ____________________________________________   EKG  Interpreted by me Normal sinus rhythm rate of 98, normal axis and intervals.  Normal QRS ST segments and T waves.  ____________________________________________    RADIOLOGY  DG Chest 2 View  Result Date: 11/17/2019 CLINICAL DATA:  Intermittent pulling sensation in her chest for the past 4 days. EXAM: CHEST - 2 VIEW COMPARISON:  None. FINDINGS: The heart size and mediastinal contours are within normal limits. Both lungs are clear. The visualized skeletal structures are unremarkable. IMPRESSION: No active cardiopulmonary disease. Electronically Signed   By: Titus Dubin M.D.   On: 11/17/2019 11:01   CT Angio Chest PE W and/or Wo Contrast  Result Date: 11/17/2019 CLINICAL DATA:  Chest pain. Positive D-dimer. Shortness of breath. EXAM: CT ANGIOGRAPHY CHEST WITH CONTRAST TECHNIQUE: Multidetector CT imaging of the chest was performed using the standard protocol during bolus administration of intravenous contrast. Multiplanar CT image reconstructions and MIPs were obtained to evaluate the vascular anatomy. CONTRAST:  53mL OMNIPAQUE IOHEXOL 350 MG/ML SOLN COMPARISON:  Chest x-ray dated 11/17/2019 and CT scan of the abdomen dated 02/03/2017 FINDINGS: Cardiovascular: Satisfactory opacification of the pulmonary arteries to the segmental level. No evidence of pulmonary embolism.  Normal heart size. No pericardial effusion. 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 No hilar or mediastinal adenopathy. Thyroid gland and esophagus are normal. Lungs/Pleura: Lungs are clear. No pleural effusion or pneumothorax. Upper Abdomen: Normal. Musculoskeletal: No chest wall abnormality. No acute or significant osseous findings. Review of the MIP images confirms the above findings. IMPRESSION: 1. No pulmonary emboli or other acute abnormalities. 2. 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 Electronically Signed   By: Lorriane Shire M.D.   On: 11/17/2019 15:53    ____________________________________________   PROCEDURES Procedures  ____________________________________________  DIFFERENTIAL DIAGNOSIS   GERD, pneumonia, pneumothorax, pulmonary embolism, dysrhythmia  CLINICAL IMPRESSION / ASSESSMENT AND PLAN / ED COURSE  Medications ordered in the ED:  Medications  iohexol (OMNIPAQUE) 350 MG/ML injection 75 mL (75 mLs Intravenous Contrast Given 11/17/19 1536)    Pertinent labs & imaging results that were available during my care of the patient were reviewed by me and considered in my medical decision making (see chart for details).  Dealva Matlick was evaluated in Emergency Department on 11/17/2019 for the symptoms described in the history of present illness. She was evaluated in the context of the global COVID-19 pandemic, which necessitated consideration that the patient might be at risk for infection with the SARS-CoV-2 virus that causes COVID-19. Institutional protocols and algorithms that pertain to the evaluation of patients at risk for COVID-19 are in a state of rapid  change based on information released by regulatory bodies including the CDC and federal and state organizations. These policies and algorithms were followed during the patient's care in the ED.   Patient presents with atypical chest pain, currently symptom-free.  With associated shortness of breath and palpitations, concerning for paroxysmal dysrhythmia, low suspicion for PE but would further risk stratify with D-dimer.  ----------------------------------------- 5:22 PM on 11/17/2019 -----------------------------------------  D-dimer result very high at 3300.  Other labs unremarkable, troponin completely negative, and with atypical symptoms and normal EKG, doubt that this needs to be repeated further chest x-ray is clear.  CT scan was obtained due to the D-dimer which is negative for PE or any other occult pulmonary pathology.  I will send a Covid swab in case these are atypical Covid symptoms causing an elevation of her D-dimer.  Plan for trial of famotidine over the weekend, follow-up with cardiology next week for further evaluation and consideration of Holter monitor given the palpitations.      ____________________________________________   FINAL CLINICAL IMPRESSION(S) / ED DIAGNOSES    Final diagnoses:  Atypical chest pain     ED Discharge Orders    None      Portions of this note were generated with dragon dictation software. Dictation errors may occur despite best attempts at proofreading.   Carrie Mew, MD 11/17/19 1723

## 2019-11-17 NOTE — Discharge Instructions (Addendum)
Your blood tests and CT scan of the chest today were all okay. Please Take famotidine two times daily over the weekend and follow up with cardiology next week (Call Monday for an appointment) for further evaluation of these symptoms.

## 2019-11-18 LAB — SARS CORONAVIRUS 2 (TAT 6-24 HRS): SARS Coronavirus 2: NEGATIVE

## 2019-11-29 DIAGNOSIS — R002 Palpitations: Secondary | ICD-10-CM | POA: Insufficient documentation

## 2019-11-29 DIAGNOSIS — R079 Chest pain, unspecified: Secondary | ICD-10-CM | POA: Insufficient documentation

## 2019-11-29 DIAGNOSIS — R0602 Shortness of breath: Secondary | ICD-10-CM | POA: Insufficient documentation

## 2020-03-15 ENCOUNTER — Emergency Department: Payer: BC Managed Care – PPO

## 2020-03-15 ENCOUNTER — Emergency Department
Admission: EM | Admit: 2020-03-15 | Discharge: 2020-03-15 | Disposition: A | Discharge status: Home / Self Care | Payer: BC Managed Care – PPO | Attending: Emergency Medicine | Admitting: Emergency Medicine

## 2020-03-15 ENCOUNTER — Other Ambulatory Visit: Payer: Self-pay

## 2020-03-15 ENCOUNTER — Encounter: Payer: Self-pay | Admitting: Emergency Medicine

## 2020-03-15 DIAGNOSIS — Z87891 Personal history of nicotine dependence: Secondary | ICD-10-CM | POA: Diagnosis not present

## 2020-03-15 DIAGNOSIS — J9801 Acute bronchospasm: Secondary | ICD-10-CM | POA: Diagnosis not present

## 2020-03-15 DIAGNOSIS — I10 Essential (primary) hypertension: Secondary | ICD-10-CM | POA: Insufficient documentation

## 2020-03-15 DIAGNOSIS — R05 Cough: Secondary | ICD-10-CM | POA: Diagnosis present

## 2020-03-15 HISTORY — DX: Essential (primary) hypertension: I10

## 2020-03-15 MED ORDER — HYDROCOD POLST-CPM POLST ER 10-8 MG/5ML PO SUER
5.0000 mL | Freq: Once | ORAL | Status: AC
  Administered 2020-03-15: 5 mL via ORAL
  Filled 2020-03-15: qty 5

## 2020-03-15 MED ORDER — BENZONATATE 100 MG PO CAPS: 200.0000 mg | ORAL_CAPSULE | Freq: Three times a day (TID) | ORAL | 0 refills | Status: DC | PRN

## 2020-03-15 MED ORDER — METHYLPREDNISOLONE 4 MG PO TBPK: ORAL_TABLET | ORAL | 0 refills | Status: DC

## 2020-03-15 MED ORDER — HYDROCOD POLST-CPM POLST ER 10-8 MG/5ML PO SUER: 5.0000 mL | Freq: Every evening | ORAL | 0 refills | Status: DC | PRN

## 2020-03-15 NOTE — Discharge Instructions (Addendum)
Follow discharge care instructions take medication as directed. °

## 2020-03-15 NOTE — ED Triage Notes (Signed)
Non prod cough for a week.  Patient has uncontrollable cough in triage.  Says it kept her up all night.  No fever

## 2020-03-15 NOTE — ED Provider Notes (Signed)
[facility] Emergency Department Provider Note   ____________________________________________   First MD Initiated Contact with Patient 03/15/20 [id number]     (approximate)  I have reviewed the triage vital signs and the nursing notes.   HISTORY  Chief Complaint Cough    HPI Lisa Hurst is a 49 y.o. female patient complain of uncontrollable nonproductive cough for 1 week.  Patient unable to sleep secondary to cough.  Patient stated mild wheezing after coughing spells.  Patient denies recent travel or known contact with COVID-19.  Patient denies fever/chills associated with complaint.  Patient denies nausea, vomiting, diarrhea.  No palliative measure for complaint.  Patient denies dyspnea or chest pain.         Past Medical History:  Diagnosis Date  . Hypertension     There are no problems to display for this patient.   Past Surgical History:  Procedure Laterality Date  . DILATION AND CURETTAGE OF UTERUS    . HERNIA REPAIR      Prior to Admission medications   Medication Sig Start Date End Date Taking? Authorizing Provider  benzonatate (TESSALON PERLES) 100 MG capsule Take 2 capsules (200 mg total) by mouth 3 (three) times daily as needed. 03/15/20 03/15/21  Sable Feil, PA-C  chlorpheniramine-HYDROcodone (TUSSIONEX PENNKINETIC ER) 10-8 MG/5ML SUER Take 5 mLs by mouth at bedtime as needed for cough. 03/15/20   Sable Feil, PA-C  methylPREDNISolone (MEDROL DOSEPAK) 4 MG TBPK tablet Take Tapered dose as directed 03/15/20   Sable Feil, PA-C    Allergies Erythromycin  No family history on file.  Social History Social History   Tobacco Use  . Smoking status: Former Research scientist (life sciences)  . Smokeless tobacco: Never Used  Substance Use Topics  . Alcohol use: Never  . Drug use: Not on file    Review of Systems Constitutional: No fever/chills Eyes: No visual changes. ENT: No sore throat. Cardiovascular: Denies chest pain. Respiratory: Denies  shortness of breath.  Nonproductive uncontrollable cough. Gastrointestinal: No abdominal pain.  No nausea, no vomiting.  No diarrhea.  No constipation. Genitourinary: Negative for dysuria. Musculoskeletal: Negative for back pain. Skin: Negative for rash. Neurological: Negative for headaches, focal weakness or numbness. Endocrine:  Hypertension Hematological/Lymphatic:  Allergic/Immunilogical: Erythromycin ____________________________________________   PHYSICAL EXAM:  VITAL SIGNS: ED Triage Vitals  Enc Vitals Group     BP 03/15/20 0802 (!) 162/75     Pulse Rate 03/15/20 0802 85     Resp 03/15/20 0802 18     Temp 03/15/20 0802 99.1 F (37.3 C)     Temp Source 03/15/20 0802 Oral     SpO2 03/15/20 0802 99 %     Weight 03/15/20 0807 138 lb (62.6 kg)     Height 03/15/20 0807 5\' 4"  (1.626 m)     Head Circumference --      Peak Flow --      Pain Score 03/15/20 0807 0     Pain Loc --      Pain Edu? --      Excl. in [place]? --    Constitutional: Alert and oriented. Well appearing and in no acute distress. Nose: No congestion/rhinnorhea. Mouth/Throat: Mucous membranes are moist.  Oropharynx non-erythematous. Neck: No stridor.  Cardiovascular: Normal rate, regular rhythm. Grossly normal heart sounds.  Good peripheral circulation.  Elevated blood pressure. Respiratory: Normal respiratory effort.  No retractions. Lungs CTAB. Skin:  Skin is warm, dry and intact. No rash noted. Psychiatric: Mood and affect are normal. Speech  and behavior are normal.  ____________________________________________   LABS (all labs ordered are listed, but only abnormal results are displayed)  Labs Reviewed - No data to display ____________________________________________  EKG   ____________________________________________  RADIOLOGY  ED MD interpretation:    Official radiology report(s): DG Chest Portable 1 View  Result Date: 03/15/2020 CLINICAL DATA:  Cough for 2-3 days. EXAM: PORTABLE CHEST 1  VIEW COMPARISON:  11/17/2019 FINDINGS: The heart size and mediastinal contours are within normal limits. Both lungs are clear. The visualized skeletal structures are unremarkable. IMPRESSION: No active disease. Electronically Signed   By: Kerby Moors M.D.   On: 03/15/2020 09:03    ____________________________________________   PROCEDURES  Procedure(s) performed (including Critical Care):  Procedures   ____________________________________________   INITIAL IMPRESSION / ASSESSMENT AND PLAN / ED COURSE  As part of my medical decision making, I reviewed the following data within the [place]     Patient presents with uncontrollable cough for 1 week.  Discussed negative chest x-ray findings with patient.  Patient complaint physical exam is consistent with bronchospasm.  Patient achieved moderate control with coughing status post Tussionex while waiting for x-ray results.  Patient given discharge care instruction.  Patient given a prescription for Tussionex, Tessalon Perles, and Medrol Dosepak.  Patient advised follow-up PCP.    Lisa Hurst was evaluated in Emergency Department on 03/15/2020 for the symptoms described in the history of present illness. She was evaluated in the context of the global COVID-19 pandemic, which necessitated consideration that the patient might be at risk for infection with the SARS-CoV-2 virus that causes COVID-19. Institutional protocols and algorithms that pertain to the evaluation of patients at risk for COVID-19 are in a state of rapid change based on information released by regulatory bodies including the CDC and federal and state organizations. These policies and algorithms were followed during the patient's care in the ED.       ____________________________________________   FINAL CLINICAL IMPRESSION(S) / ED DIAGNOSES  Final diagnoses:  Cough due to bronchospasm     ED Discharge Orders         Ordered     chlorpheniramine-HYDROcodone (TUSSIONEX PENNKINETIC ER) 10-8 MG/5ML SUER  At bedtime PRN     Discontinue  Reprint     03/15/20 0917    benzonatate (TESSALON PERLES) 100 MG capsule  3 times daily PRN     Discontinue  Reprint     03/15/20 0917    methylPREDNISolone (MEDROL DOSEPAK) 4 MG TBPK tablet     Discontinue  Reprint     03/15/20 0917           Note:  This document was prepared using Dragon voice recognition software and may include unintentional dictation errors.    Sable Feil, PA-C 03/15/20 [id number]    Harvest Dark, MD 03/15/20 1445

## 2020-03-15 NOTE — ED Notes (Signed)
See triage note  Presents with cough for the past 2-3 days  No fever  States cough is prod but clear liquid   Low grade temp on arrival

## 2020-06-24 DIAGNOSIS — I10 Essential (primary) hypertension: Secondary | ICD-10-CM | POA: Insufficient documentation

## 2020-10-05 HISTORY — PX: BRAIN SURGERY: SHX531

## 2020-12-09 ENCOUNTER — Telehealth (INDEPENDENT_AMBULATORY_CARE_PROVIDER_SITE_OTHER): Payer: Self-pay | Admitting: Gastroenterology

## 2020-12-09 ENCOUNTER — Other Ambulatory Visit: Payer: Self-pay

## 2020-12-09 DIAGNOSIS — Z1211 Encounter for screening for malignant neoplasm of colon: Secondary | ICD-10-CM

## 2020-12-09 MED ORDER — NA SULFATE-K SULFATE-MG SULF 17.5-3.13-1.6 GM/177ML PO SOLN: 1.0000 | Freq: Once | ORAL | 0 refills | Status: AC

## 2020-12-09 NOTE — Progress Notes (Signed)
Gastroenterology Pre-Procedure Review  Request Date: Monday 01/06/21 Requesting Physician: Dr. Vicente Males  PATIENT REVIEW QUESTIONS: The patient responded to the following health history questions as indicated:    1. Are you having any GI issues? no 2. Do you have a personal history of Polyps? no 3. Do you have a family history of Colon Cancer or Polyps? yes (uncle had colon cancer, maybe other family members as well but not sure) 4. Diabetes Mellitus? no 5. Joint replacements in the past 12 months?no 6. Major health problems in the past 3 months?no 7. Any artificial heart valves, MVP, or defibrillator?no    MEDICATIONS & ALLERGIES:    Patient reports the following regarding taking any anticoagulation/antiplatelet therapy:   Plavix, Coumadin, Eliquis, Xarelto, Lovenox, Pradaxa, Brilinta, or Effient? no Aspirin? no  Patient confirms/reports the following medications:  Current Outpatient Medications  Medication Sig Dispense Refill  . ACETAMINOPHEN-BUTALBITAL 50-325 MG TABS     . amLODipine (NORVASC) 10 MG tablet Take 10 mg by mouth daily.    . busPIRone (BUSPAR) 15 MG tablet Take 15 mg by mouth 2 (two) times daily as needed.    . citalopram (CELEXA) 40 MG tablet Take 40 mg by mouth daily.    Marland Kitchen losartan (COZAAR) 100 MG tablet Take 100 mg by mouth daily.    . montelukast (SINGULAIR) 10 MG tablet Take 10 mg by mouth daily.     No current facility-administered medications for this visit.    Patient confirms/reports the following allergies:  Allergies  Allergen Reactions  . Erythromycin Hives    No orders of the defined types were placed in this encounter.   AUTHORIZATION INFORMATION Primary Insurance: 1D#: Group #:  Secondary Insurance: 1D#: Group #:  SCHEDULE INFORMATION: Date: Monday 01/06/21 Time: Location:[facility]

## 2021-01-03 ENCOUNTER — Other Ambulatory Visit: Payer: Self-pay

## 2021-01-03 ENCOUNTER — Other Ambulatory Visit
Admission: RE | Admit: 2021-01-03 | Discharge: 2021-01-03 | Disposition: A | Discharge status: Home / Self Care | Payer: Medicaid Other | Source: Ambulatory Visit | Attending: Gastroenterology | Admitting: Gastroenterology

## 2021-01-03 DIAGNOSIS — Z20822 Contact with and (suspected) exposure to covid-19: Secondary | ICD-10-CM | POA: Insufficient documentation

## 2021-01-03 DIAGNOSIS — Z01812 Encounter for preprocedural laboratory examination: Secondary | ICD-10-CM | POA: Insufficient documentation

## 2021-01-04 LAB — SARS CORONAVIRUS 2 (TAT 6-24 HRS): SARS Coronavirus 2: NEGATIVE

## 2021-01-06 ENCOUNTER — Ambulatory Visit: Payer: Medicaid Other | Admitting: Certified Registered"

## 2021-01-06 ENCOUNTER — Encounter: Payer: Self-pay | Admitting: Gastroenterology

## 2021-01-06 ENCOUNTER — Ambulatory Visit
Admission: RE | Admit: 2021-01-06 | Discharge: 2021-01-06 | Disposition: A | Discharge status: Home / Self Care | Payer: Medicaid Other | Source: Ambulatory Visit | Attending: Gastroenterology | Admitting: Gastroenterology

## 2021-01-06 ENCOUNTER — Encounter
Admission: RE | Disposition: A | Discharge status: Home / Self Care | Payer: Self-pay | Source: Ambulatory Visit | Attending: Gastroenterology

## 2021-01-06 DIAGNOSIS — D123 Benign neoplasm of transverse colon: Secondary | ICD-10-CM | POA: Diagnosis not present

## 2021-01-06 DIAGNOSIS — Z79899 Other long term (current) drug therapy: Secondary | ICD-10-CM | POA: Diagnosis not present

## 2021-01-06 DIAGNOSIS — I10 Essential (primary) hypertension: Secondary | ICD-10-CM | POA: Insufficient documentation

## 2021-01-06 DIAGNOSIS — Z1211 Encounter for screening for malignant neoplasm of colon: Secondary | ICD-10-CM | POA: Insufficient documentation

## 2021-01-06 DIAGNOSIS — Z881 Allergy status to other antibiotic agents status: Secondary | ICD-10-CM | POA: Insufficient documentation

## 2021-01-06 DIAGNOSIS — F17200 Nicotine dependence, unspecified, uncomplicated: Secondary | ICD-10-CM | POA: Diagnosis not present

## 2021-01-06 DIAGNOSIS — K635 Polyp of colon: Secondary | ICD-10-CM | POA: Diagnosis not present

## 2021-01-06 DIAGNOSIS — K640 First degree hemorrhoids: Secondary | ICD-10-CM | POA: Diagnosis not present

## 2021-01-06 DIAGNOSIS — D124 Benign neoplasm of descending colon: Secondary | ICD-10-CM | POA: Insufficient documentation

## 2021-01-06 DIAGNOSIS — Z8371 Family history of colonic polyps: Secondary | ICD-10-CM | POA: Diagnosis not present

## 2021-01-06 HISTORY — PX: COLONOSCOPY WITH PROPOFOL: SHX5780

## 2021-01-06 LAB — POCT PREGNANCY, URINE: Preg Test, Ur: NEGATIVE

## 2021-01-06 SURGERY — COLONOSCOPY WITH PROPOFOL
Anesthesia: General

## 2021-01-06 MED ORDER — LIDOCAINE 2% (20 MG/ML) 5 ML SYRINGE
INTRAMUSCULAR | Status: DC | PRN
  Administered 2021-01-06: 25 mg via INTRAVENOUS

## 2021-01-06 MED ORDER — PROPOFOL 500 MG/50ML IV EMUL
INTRAVENOUS | Status: DC | PRN
  Administered 2021-01-06: 120 ug/kg/min via INTRAVENOUS

## 2021-01-06 MED ORDER — MIDAZOLAM HCL 2 MG/2ML IJ SOLN
INTRAMUSCULAR | Status: AC
  Filled 2021-01-06: qty 2

## 2021-01-06 MED ORDER — MIDAZOLAM HCL 5 MG/5ML IJ SOLN
INTRAMUSCULAR | Status: DC | PRN
  Administered 2021-01-06: 2 mg via INTRAVENOUS

## 2021-01-06 MED ORDER — SODIUM CHLORIDE 0.9 % IV SOLN: INTRAVENOUS | Status: DC

## 2021-01-06 MED ORDER — PROPOFOL 10 MG/ML IV BOLUS
INTRAVENOUS | Status: DC | PRN
  Administered 2021-01-06: 20 mg via INTRAVENOUS
  Administered 2021-01-06: 100 mg via INTRAVENOUS

## 2021-01-06 NOTE — Transfer of Care (Signed)
Immediate Anesthesia Transfer of Care Note  Patient: Lisa Hurst  Procedure(s) Performed: COLONOSCOPY WITH PROPOFOL (N/A )  Patient Location: Endoscopy Unit  Anesthesia Type:General  Level of Consciousness: awake and alert   Airway & Oxygen Therapy: Patient Spontanous Breathing  Post-op Assessment: Report given to RN and Post -op Vital signs reviewed and stable  Post vital signs: Reviewed  Last Vitals:  Vitals Value Taken Time  BP    Temp    Pulse    Resp    SpO2      Last Pain:  Vitals:   01/06/21 0832  TempSrc: Temporal  PainSc: 0-No pain         Complications: No complications documented.

## 2021-01-06 NOTE — Anesthesia Postprocedure Evaluation (Signed)
Anesthesia Post Note  Patient: Lisa Hurst  Procedure(s) Performed: COLONOSCOPY WITH PROPOFOL (N/A )  Patient location during evaluation: PACU Anesthesia Type: General Level of consciousness: awake and alert Pain management: pain level controlled Vital Signs Assessment: post-procedure vital signs reviewed and stable Respiratory status: spontaneous breathing and respiratory function stable Cardiovascular status: stable Anesthetic complications: no   No complications documented.   Last Vitals:  Vitals:   01/06/21 0933 01/06/21 0943  BP: 109/71 (!) 155/88  Pulse:    Resp:    Temp:    SpO2:      Last Pain:  Vitals:   01/06/21 0953  TempSrc:   PainSc: 0-No pain                 Breslyn Abdo K

## 2021-01-06 NOTE — H&P (Signed)
     Jonathon Bellows, MD [address], [place], [place], [place], [address] [address], [place], [place], [place], [address] Phone: [phone]  Fax: [phone]  Primary Care Physician:  Martin Majestic, FNP   Pre-Procedure History & Physical: HPI:  Lisa Hurst is a 50 y.o. female is here for an colonoscopy.   Past Medical History:  Diagnosis Date  . Hypertension     Past Surgical History:  Procedure Laterality Date  . DILATION AND CURETTAGE OF UTERUS    . HERNIA REPAIR      Prior to Admission medications   Medication Sig Start Date End Date Taking? Authorizing Provider  amLODipine (NORVASC) 10 MG tablet Take 10 mg by mouth daily. 10/20/20  Yes [provider]  losartan (COZAAR) 100 MG tablet Take 100 mg by mouth daily. 10/29/20  Yes [provider]  ACETAMINOPHEN-BUTALBITAL 50-325 MG TABS     [provider]  busPIRone (BUSPAR) 15 MG tablet Take 15 mg by mouth 2 (two) times daily as needed. 11/21/20   [provider]  citalopram (CELEXA) 40 MG tablet Take 40 mg by mouth daily. 11/21/20   [provider]  montelukast (SINGULAIR) 10 MG tablet Take 10 mg by mouth daily. 11/21/20   [provider]    Allergies as of 12/09/2020 - Review Complete 12/09/2020  Allergen Reaction Noted  . Erythromycin Hives 02/03/2017    History reviewed. No pertinent family history.  Social History   Socioeconomic History  . Marital status: Single    Spouse name: Not on file  . Number of children: Not on file  . Years of education: Not on file  . Highest education level: Not on file  Occupational History  . Not on file  Tobacco Use  . Smoking status: Current Some Day Smoker  . Smokeless tobacco: Never Used  Vaping Use  . Vaping Use: Never used  Substance and Sexual Activity  . Alcohol use: Never  . Drug use: Never  . Sexual activity: Not on file  Other Topics Concern  . Not on file  Social History Narrative  . Not  on file   Social Determinants of Health   Financial Resource Strain: Not on file  Food Insecurity: Not on file  Transportation Needs: Not on file  Physical Activity: Not on file  Stress: Not on file  Social Connections: Not on file  Intimate Partner Violence: Not on file    Review of Systems: See HPI, otherwise negative ROS  Physical Exam: BP (!) 122/92   Pulse 75   Temp (!) 96.6 F (35.9 C) (Temporal)   Resp 16   Ht 5\' 4"  (1.626 m)   Wt 63.5 kg   SpO2 100%   BMI 24.03 kg/m  General:   Alert,  pleasant and cooperative in NAD Head:  Normocephalic and atraumatic. Neck:  Supple; no masses or thyromegaly. Lungs:  Clear throughout to auscultation, normal respiratory effort.    Heart:  +S1, +S2, Regular rate and rhythm, No edema. Abdomen:  Soft, nontender and nondistended. Normal bowel sounds, without guarding, and without rebound.   Neurologic:  Alert and  oriented x4;  grossly normal neurologically.  Impression/Plan: HOKULANI ROGEL is here for an colonoscopy to be performed for Screening family history of colon polyps Risks, benefits, limitations, and alternatives regarding  colonoscopy have been reviewed with the patient.  Questions have been answered.  All parties agreeable.   Jonathon Bellows, MD  01/06/2021, 8:57 AM

## 2021-01-06 NOTE — Anesthesia Preprocedure Evaluation (Addendum)
Anesthesia Evaluation  Patient identified by MRN, date of birth, ID band Patient awake    Reviewed: Allergy & Precautions, NPO status , Patient's Chart, lab work & pertinent test results  History of Anesthesia Complications Negative for: history of anesthetic complications  Airway Mallampati: III  TM Distance: >3 FB Neck ROM: Full    Dental no notable dental hx. (+) Teeth Intact   Pulmonary neg sleep apnea, neg COPD, Current Smoker and Patient abstained from smoking.,    Pulmonary exam normal breath sounds clear to auscultation       Cardiovascular Exercise Tolerance: Good METShypertension, (-) CAD and (-) Past MI (-) dysrhythmias  Rhythm:Regular Rate:Normal - Systolic murmurs The patient underwent stress echocardiogram on 12/14/2019, exercising for 10 minutes on a Bruce protocol without chest pain or ECG changes with appropriate blood pressure response. At baseline, 2D echocardiogram revealed normal left ventricular function with LVEF greater than 55%. At peak exercise, there was appropriate augmentation of all myocardial segments. There was evidence of mild mitral and tricuspid regurgitation.   Neuro/Psych negative neurological ROS  negative psych ROS   GI/Hepatic neg GERD  ,(+)     (-) substance abuse  ,   Endo/Other  neg diabetes  Renal/GU negative Renal ROS     Musculoskeletal   Abdominal   Peds  Hematology   Anesthesia Other Findings Past Medical History: No date: Hypertension  Reproductive/Obstetrics                           Anesthesia Physical Anesthesia Plan  ASA: II  Anesthesia Plan: General   Post-op Pain Management:    Induction: Intravenous  PONV Risk Score and Plan: 2 and Ondansetron, Propofol infusion and TIVA  Airway Management Planned: Nasal Cannula  Additional Equipment: None  Intra-op Plan:   Post-operative Plan:   Informed Consent: I have reviewed the  patients History and Physical, chart, labs and discussed the procedure including the risks, benefits and alternatives for the proposed anesthesia with the patient or authorized representative who has indicated his/her understanding and acceptance.     Dental advisory given  Plan Discussed with: CRNA and Surgeon  Anesthesia Plan Comments: (Discussed risks of anesthesia with patient, including possibility of difficulty with spontaneous ventilation under anesthesia necessitating airway intervention, PONV, and rare risks such as cardiac or respiratory or neurological events. Patient understands. Patient counseled on benefits of smoking cessation, and increased perioperative risks associated with continued smoking. )        Anesthesia Quick Evaluation

## 2021-01-06 NOTE — Op Note (Signed)
[facility] Gastroenterology Patient Name: Lisa Hurst Procedure Date: 01/06/2021 9:00 AM MRN: [id number] Account #: [id number] Date of Birth: 12/11/1970 Admit Type: Outpatient Age: 50 Room: [facility] ENDO ROOM 2 Gender: Female Note Status: Finalized Procedure:             Colonoscopy Indications:           Colon cancer screening in patient at increased risk:                         Family history of 1st-degree relative with colon polyps Providers:             Jonathon Bellows MD, MD Referring MD:          No Local Md, MD (Referring MD) Medicines:             Monitored Anesthesia Care Complications:         No immediate complications. Procedure:             Pre-Anesthesia Assessment:                        - Prior to the procedure, a History and Physical was                         performed, and patient medications, allergies and                         sensitivities were reviewed. The patient's tolerance                         of previous anesthesia was reviewed.                        - The risks and benefits of the procedure and the                         sedation options and risks were discussed with the                         patient. All questions were answered and informed                         consent was obtained.                        - ASA Grade Assessment: II - A patient with mild                         systemic disease.                        After obtaining informed consent, the colonoscope was                         passed under direct vision. Throughout the procedure,                         the patient's blood pressure, pulse, and oxygen  saturations were monitored continuously. The                         Colonoscope was introduced through the anus and                         advanced to the the cecum, identified by the                         appendiceal orifice. The colonoscopy was performed                         with  ease. The patient tolerated the procedure well.                         The quality of the bowel preparation was excellent. Findings:      The perianal and digital rectal examinations were normal.      Two sessile polyps were found in the descending colon and transverse       colon. The polyps were 4 to 6 mm in size. These polyps were removed with       a cold snare. Resection and retrieval were complete.      Non-bleeding internal hemorrhoids were found during retroflexion. The       hemorrhoids were medium-sized and Grade I (internal hemorrhoids that do       not prolapse).      The exam was otherwise without abnormality on direct and retroflexion       views. Impression:            - Two 4 to 6 mm polyps in the descending colon and in                         the transverse colon, removed with a cold snare.                         Resected and retrieved.                        - Non-bleeding internal hemorrhoids.                        - The examination was otherwise normal on direct and                         retroflexion views. Recommendation:        - Discharge patient to home (with escort).                        - Resume previous diet.                        - Continue present medications.                        - Await pathology results.                        - Repeat colonoscopy in 5 years for surveillance. Procedure Code(s):     --- Professional ---  45385, Colonoscopy, flexible; with removal of                         tumor(s), polyp(s), or other lesion(s) by snare                         technique Diagnosis Code(s):     --- Professional ---                        Z83.71, Family history of colonic polyps                        K63.5, Polyp of colon                        K64.0, First degree hemorrhoids CPT copyright 2019 American Medical Association. All rights reserved. The codes documented in this report are preliminary and upon coder review may   be revised to meet current compliance requirements. Jonathon Bellows, MD Jonathon Bellows MD, MD 01/06/2021 9:24:01 AM This report has been signed electronically. Number of Addenda: 0 Note Initiated On: 01/06/2021 9:00 AM Scope Withdrawal Time: 0 hours 10 minutes 18 seconds  Total Procedure Duration: 0 hours 14 minutes 30 seconds  Estimated Blood Loss:  Estimated blood loss: none.      [facility]

## 2021-01-07 ENCOUNTER — Encounter: Payer: Self-pay | Admitting: Gastroenterology

## 2021-01-07 LAB — SURGICAL PATHOLOGY

## 2021-02-15 ENCOUNTER — Emergency Department: Payer: Medicaid Other

## 2021-02-15 ENCOUNTER — Encounter: Payer: Self-pay | Admitting: Intensive Care

## 2021-02-15 ENCOUNTER — Emergency Department
Admission: EM | Admit: 2021-02-15 | Discharge: 2021-02-15 | Disposition: A | Discharge status: Home / Self Care | Payer: Medicaid Other | Attending: Emergency Medicine | Admitting: Emergency Medicine

## 2021-02-15 ENCOUNTER — Other Ambulatory Visit: Payer: Self-pay

## 2021-02-15 DIAGNOSIS — Z79899 Other long term (current) drug therapy: Secondary | ICD-10-CM | POA: Diagnosis not present

## 2021-02-15 DIAGNOSIS — M5412 Radiculopathy, cervical region: Secondary | ICD-10-CM | POA: Insufficient documentation

## 2021-02-15 DIAGNOSIS — I10 Essential (primary) hypertension: Secondary | ICD-10-CM | POA: Diagnosis not present

## 2021-02-15 DIAGNOSIS — Z87891 Personal history of nicotine dependence: Secondary | ICD-10-CM | POA: Diagnosis not present

## 2021-02-15 DIAGNOSIS — M79601 Pain in right arm: Secondary | ICD-10-CM | POA: Insufficient documentation

## 2021-02-15 DIAGNOSIS — M25512 Pain in left shoulder: Secondary | ICD-10-CM | POA: Diagnosis present

## 2021-02-15 HISTORY — DX: Anxiety disorder, unspecified: F41.9

## 2021-02-15 MED ORDER — PREDNISONE 10 MG PO TABS: ORAL_TABLET | ORAL | 0 refills | Status: DC

## 2021-02-15 MED ORDER — HYDROCODONE-ACETAMINOPHEN 5-325 MG PO TABS: 1.0000 | ORAL_TABLET | Freq: Four times a day (QID) | ORAL | 0 refills | Status: AC | PRN

## 2021-02-15 MED ORDER — METHOCARBAMOL 500 MG PO TABS: 500.0000 mg | ORAL_TABLET | Freq: Four times a day (QID) | ORAL | 0 refills | Status: DC | PRN

## 2021-02-15 NOTE — ED Provider Notes (Signed)
[facility] Emergency Department Provider Note   ____________________________________________   Event Date/Time   First MD Initiated Contact with Patient 02/15/21 [phone]     (approximate)  I have reviewed the triage vital signs and the nursing notes.   HISTORY  Chief Complaint Shoulder Pain   HPI Lisa Hurst is a 50 y.o. female presents to the ED with complaint of left shoulder pain that radiates down her arm.  Patient states that last week she was seen by her PCP who injected steroids into her trapezius muscles bilaterally.  Patient states that she has had problems with pain radiating down her left shoulder intermittently and also more infrequently down her right arm.  She denies any injury to her neck in the past.  She reports that since the steroid injections she feels that things are worse then before the injections.  She rates her pain as 4 out of 10.         Past Medical History:  Diagnosis Date  . Anxiety   . Hypertension     Patient Active Problem List   Diagnosis Date Noted  . Essential hypertension 06/24/2020  . Chest pain with low risk for cardiac etiology 11/29/2019  . Heart palpitations 11/29/2019  . SOB (shortness of breath) on exertion 11/29/2019    Past Surgical History:  Procedure Laterality Date  . COLONOSCOPY WITH PROPOFOL N/A 01/06/2021   Procedure: COLONOSCOPY WITH PROPOFOL;  Surgeon: Jonathon Bellows, MD;  Location: [facility] ENDOSCOPY;  Service: Gastroenterology;  Laterality: N/A;  . DILATION AND CURETTAGE OF UTERUS    . HERNIA REPAIR      Prior to Admission medications   Medication Sig Start Date End Date Taking? Authorizing Provider  HYDROcodone-acetaminophen (NORCO/VICODIN) 5-325 MG tablet Take 1 tablet by mouth every 6 (six) hours as needed. 02/15/21 02/15/22 Yes Christl Fessenden L, PA-C  methocarbamol (ROBAXIN) 500 MG tablet Take 1 tablet (500 mg total) by mouth every 6 (six) hours as needed. 02/15/21  Yes Johnn Hai,  PA-C  predniSONE (DELTASONE) 10 MG tablet Take 6 tablets  today, on day 2 take 5 tablets, day 3 take 4 tablets, day 4 take 3 tablets, day 5 take  2 tablets and 1 tablet the last day 02/15/21  Yes Johnn Hai, PA-C  ACETAMINOPHEN-BUTALBITAL 50-325 MG TABS     [provider]  amLODipine (NORVASC) 10 MG tablet Take 10 mg by mouth daily. 10/20/20   [provider]  busPIRone (BUSPAR) 15 MG tablet Take 15 mg by mouth 2 (two) times daily as needed. 11/21/20   [provider]  citalopram (CELEXA) 40 MG tablet Take 40 mg by mouth daily. 11/21/20   [provider]  losartan (COZAAR) 100 MG tablet Take 100 mg by mouth daily. 10/29/20   [provider]  montelukast (SINGULAIR) 10 MG tablet Take 10 mg by mouth daily. 11/21/20   [provider]    Allergies Apple and Erythromycin  History reviewed. No pertinent family history.  Social History Social History   Tobacco Use  . Smoking status: Former Research scientist (life sciences)  . Smokeless tobacco: Never Used  Vaping Use  . Vaping Use: Never used  Substance Use Topics  . Alcohol use: Never  . Drug use: Yes    Types: Marijuana    Review of Systems Constitutional: No fever/chills Eyes: No visual changes. ENT: No sore throat. Cardiovascular: Denies chest pain. Respiratory: Denies shortness of breath. Gastrointestinal: No abdominal pain.  No nausea, no vomiting.  Genitourinary:  Negative for dysuria. Musculoskeletal: Left shoulder pain with radiculopathy.  Cervical pain. Skin: Negative for rash. Neurological: Negative for headaches, focal weakness or numbness. ____________________________________________   PHYSICAL EXAM:  VITAL SIGNS: ED Triage Vitals  Enc Vitals Group     BP 02/15/21 0747 133/73     Pulse Rate 02/15/21 0747 68     Resp 02/15/21 0747 16     Temp 02/15/21 0747 98.3 F (36.8 C)     Temp Source 02/15/21 0747 Oral     SpO2 02/15/21 0747 100 %     Weight 02/15/21 0745 138 lb (62.6 kg)      Height 02/15/21 0745 5\' 4"  (1.626 m)     Head Circumference --      Peak Flow --      Pain Score 02/15/21 0745 4     Pain Loc --      Pain Edu? --      Excl. in [place]? --    Constitutional: Alert and oriented. Well appearing and in no acute distress. Eyes: Conjunctivae are normal. PERRL. EOMI. Head: Atraumatic. Neck: No stridor.  Minimal tenderness on palpation of the cervical spine posteriorly.  There is moderate tenderness on palpation of the paravertebral muscles down into the trapezius muscles bilaterally.  Range of motion is slow and guarded secondary to discomfort. Cardiovascular: Normal rate, regular rhythm. Grossly normal heart sounds.  Good peripheral circulation. Respiratory: Normal respiratory effort.  No retractions. Lungs CTAB. Gastrointestinal: Soft and nontender. No distention.  Musculoskeletal: Deformities noted on examination of left shoulder however range of motion is moderately restricted secondary to increased pain.  There is no crepitus appreciated with limited range of motion.  No warmth or erythema noted.  Pulses present distally.  Skin is intact and no discoloration noted. Neurologic:  Normal speech and language. No gross focal neurologic deficits are appreciated. No gait instability. Skin:  Skin is warm, dry and intact. No rash noted. Psychiatric: Mood and affect are normal. Speech and behavior are normal.  ____________________________________________   LABS (all labs ordered are listed, but only abnormal results are displayed)  Labs Reviewed - No data to display ____________________________________________  RADIOLOGY I, Johnn Hai, personally viewed and evaluated these images (plain radiographs) as part of my medical decision making, as well as reviewing the written report by the radiologist.  Official radiology report(s): DG Cervical Spine 2-3 Views  Result Date: 02/15/2021 CLINICAL DATA:  Pain with radiculopathy. EXAM: CERVICAL SPINE - 2-3 VIEW  COMPARISON:  None. FINDINGS: There is no evidence of cervical spine fracture or prevertebral soft tissue swelling. Alignment is normal. No other significant bone abnormalities are identified. IMPRESSION: Negative cervical spine radiographs. Electronically Signed   By: Kerby Moors M.D.   On: 02/15/2021 09:10   DG Shoulder Left  Result Date: 02/15/2021 CLINICAL DATA:  Complains of pain in neck radiating into left arm and hand intermittently. EXAM: LEFT SHOULDER - 2+ VIEW COMPARISON:  None. FINDINGS: There is no evidence of fracture or dislocation. There is no evidence of arthropathy or other focal bone abnormality. Soft tissues are unremarkable. IMPRESSION: Negative. Electronically Signed   By: Kerby Moors M.D.   On: 02/15/2021 09:07    ____________________________________________   PROCEDURES  Procedure(s) performed (including Critical Care):  Procedures   ____________________________________________   INITIAL IMPRESSION / ASSESSMENT AND PLAN / ED COURSE  As part of my medical decision making, I reviewed the following data within the electronic MEDICAL RECORD NUMBER Notes from prior ED visits and [place] Controlled  Substance Database  50 year old female presents to the ED with complaint of cervical pain and left shoulder pain.  Patient was seen by her PCP recently where she got steroid injections which she states has made her pain much worse.  There was no previous history of an injury.  Left shoulder x-ray and cervical spine were negative.  Patient was reassured.  Prescriptions for prednisone Dosepak, methocarbamol and hydrocodone was sent to her pharmacy.  She is encouraged to use ice or heat to her neck as needed for discomfort.  She is to follow-up with Dr. Rudene Christians if she continues to have cervical radiculopathy or any further musculoskeletal pain.  ____________________________________________   FINAL CLINICAL IMPRESSION(S) / ED DIAGNOSES  Final diagnoses:  Cervical radicular pain      ED Discharge Orders         Ordered    methocarbamol (ROBAXIN) 500 MG tablet  Every 6 hours PRN        02/15/21 0919    HYDROcodone-acetaminophen (NORCO/VICODIN) 5-325 MG tablet  Every 6 hours PRN        02/15/21 0919    predniSONE (DELTASONE) 10 MG tablet        02/15/21 0919          *Please note:  Jeff B Teachey was evaluated in Emergency Department on 02/15/2021 for the symptoms described in the history of present illness. She was evaluated in the context of the global COVID-19 pandemic, which necessitated consideration that the patient might be at risk for infection with the SARS-CoV-2 virus that causes COVID-19. Institutional protocols and algorithms that pertain to the evaluation of patients at risk for COVID-19 are in a state of rapid change based on information released by regulatory bodies including the CDC and federal and state organizations. These policies and algorithms were followed during the patient's care in the ED.  Some ED evaluations and interventions may be delayed as a result of limited staffing during and the pandemic.*   Note:  This document was prepared using Dragon voice recognition software and may include unintentional dictation errors.    Johnn Hai, PA-C 02/15/21 1549    Lavonia Drafts, MD 02/15/21 [phone]

## 2021-02-15 NOTE — ED Triage Notes (Signed)
Patient c/o left shoulder pain that radiates down arm to hand intermittently. Recent steroid shots given with no relief. Denies injury

## 2021-02-15 NOTE — Discharge Instructions (Addendum)
Call make an appointment with Dr. Rudene Christians who is the orthopedist on-call and is located in [facility].  The phone number and address is listed on your discharge papers.  Begin taking the hydrocodone, methocarbamol for pain and to relax muscles.  Do not take these medications while driving or operating machinery as it could cause drowsiness and increase your risk for injury.  The prednisone begins with 6 tablets today and decreases by 1 tablet every day until you are finished.  Use ice or heat to your neck and shoulders as needed for discomfort.  You may also use over-the-counter cream such as Biofreeze which may give you some temporary relief.  Do not take the acetaminophen butalbital that was prescribed by your doctor while taking these medications.

## 2021-05-07 ENCOUNTER — Other Ambulatory Visit: Payer: Self-pay | Admitting: Physician Assistant

## 2021-05-07 DIAGNOSIS — M5013 Cervical disc disorder with radiculopathy, cervicothoracic region: Secondary | ICD-10-CM

## 2021-05-17 IMAGING — CR DG SHOULDER 2+V*L*
3 series · 3 of 3 positions shown · non-contrast
Comparison: None.

CLINICAL DATA: Complains of pain in neck radiating into left arm
and hand intermittently.

EXAM:
LEFT SHOULDER - 2+ VIEW

[shoulder grashey]
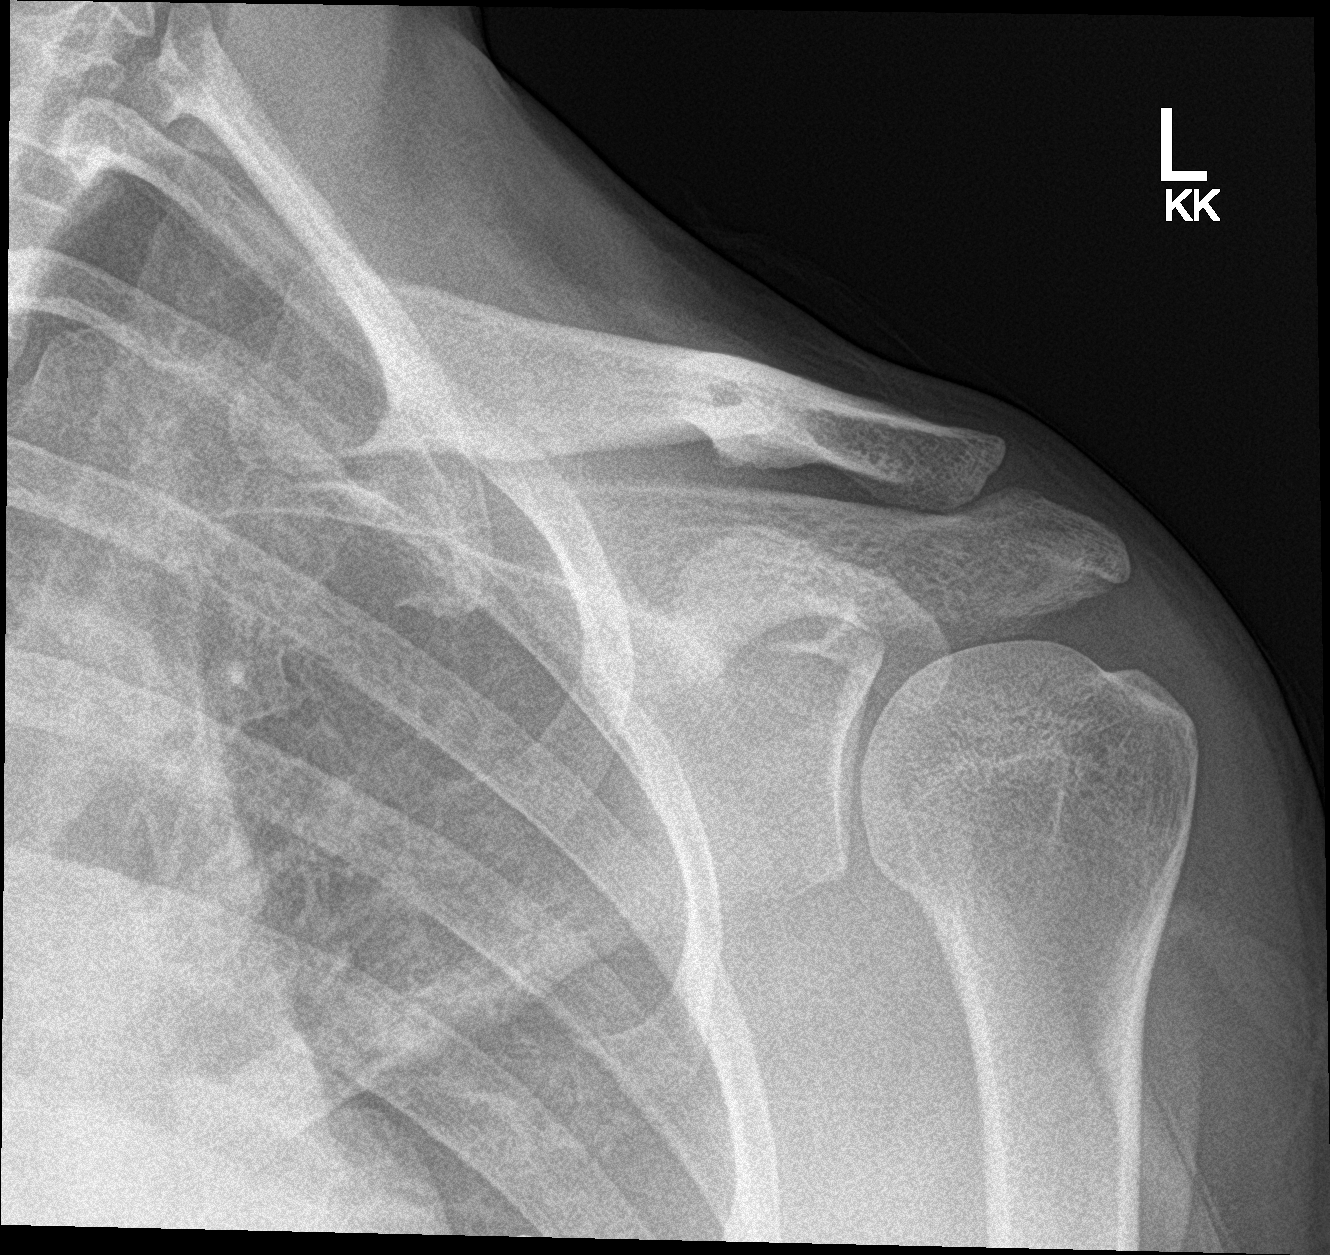

[shoulder y view]
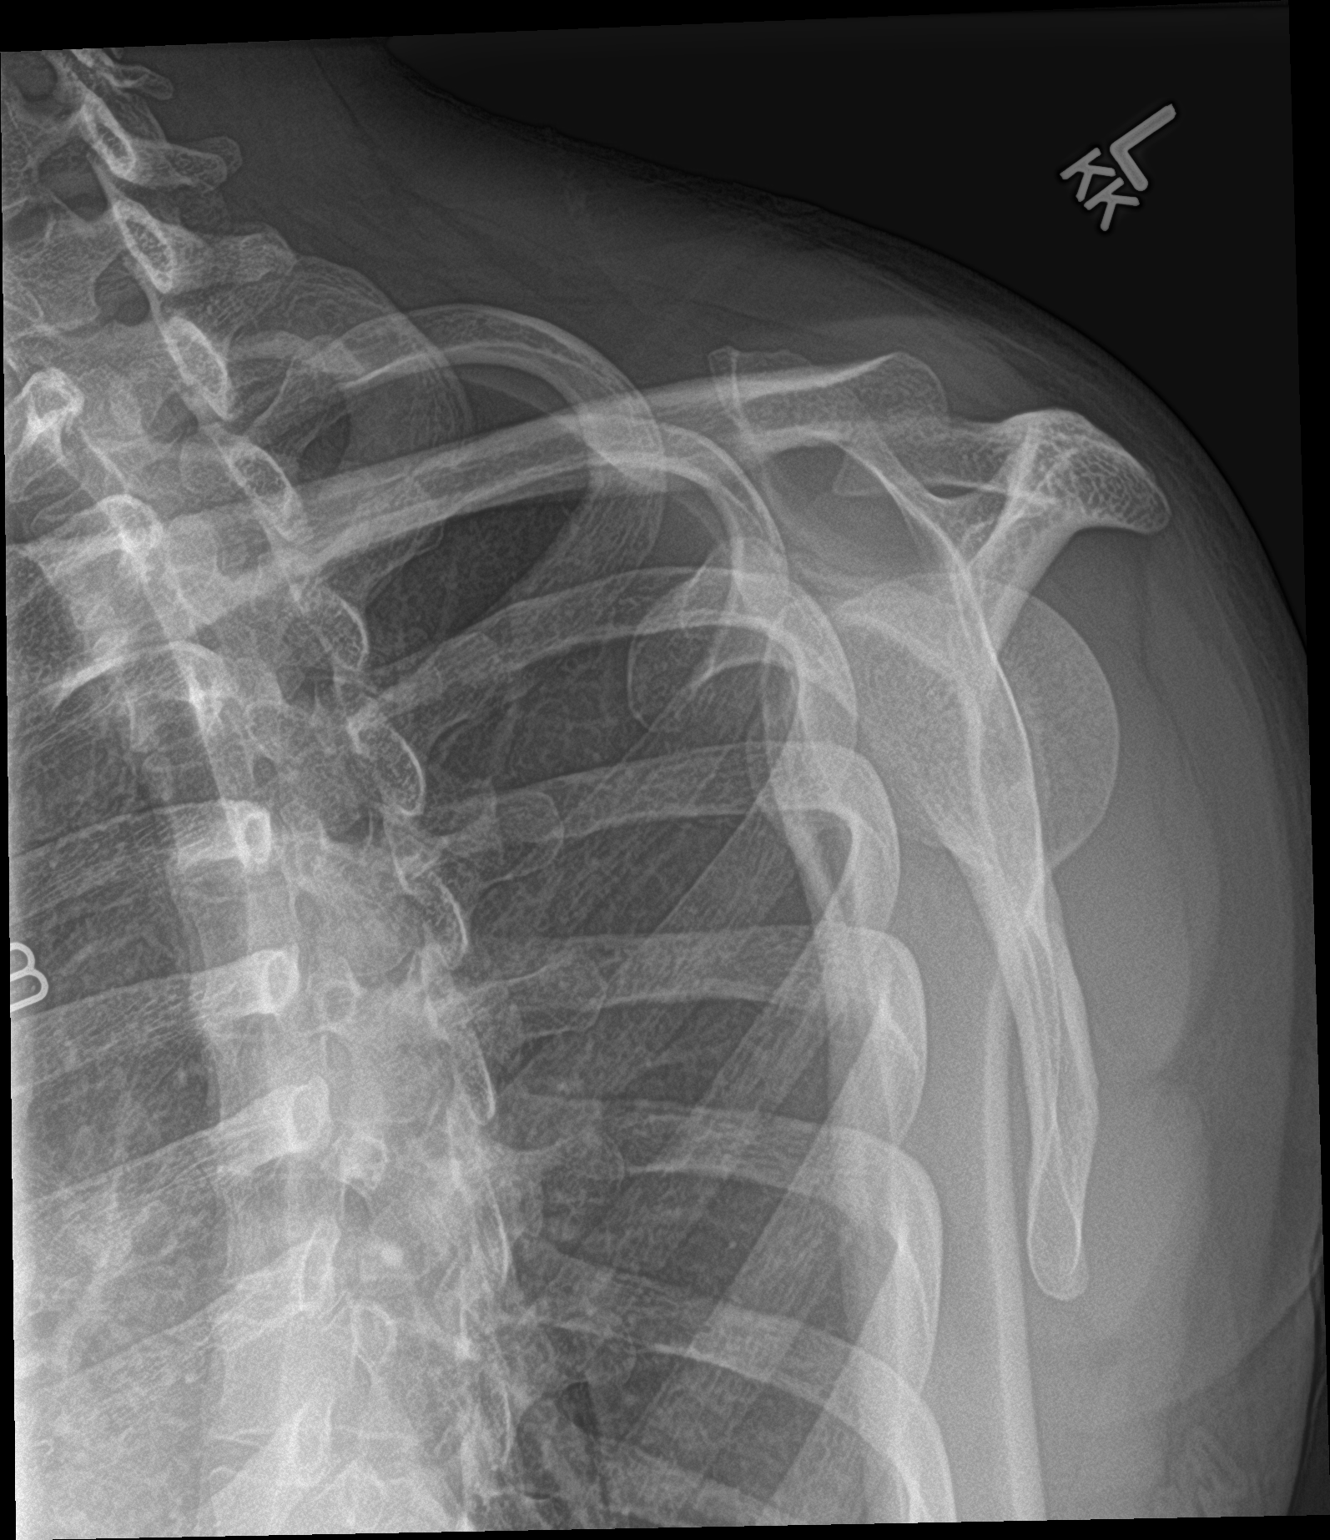

[shoulder axillary]
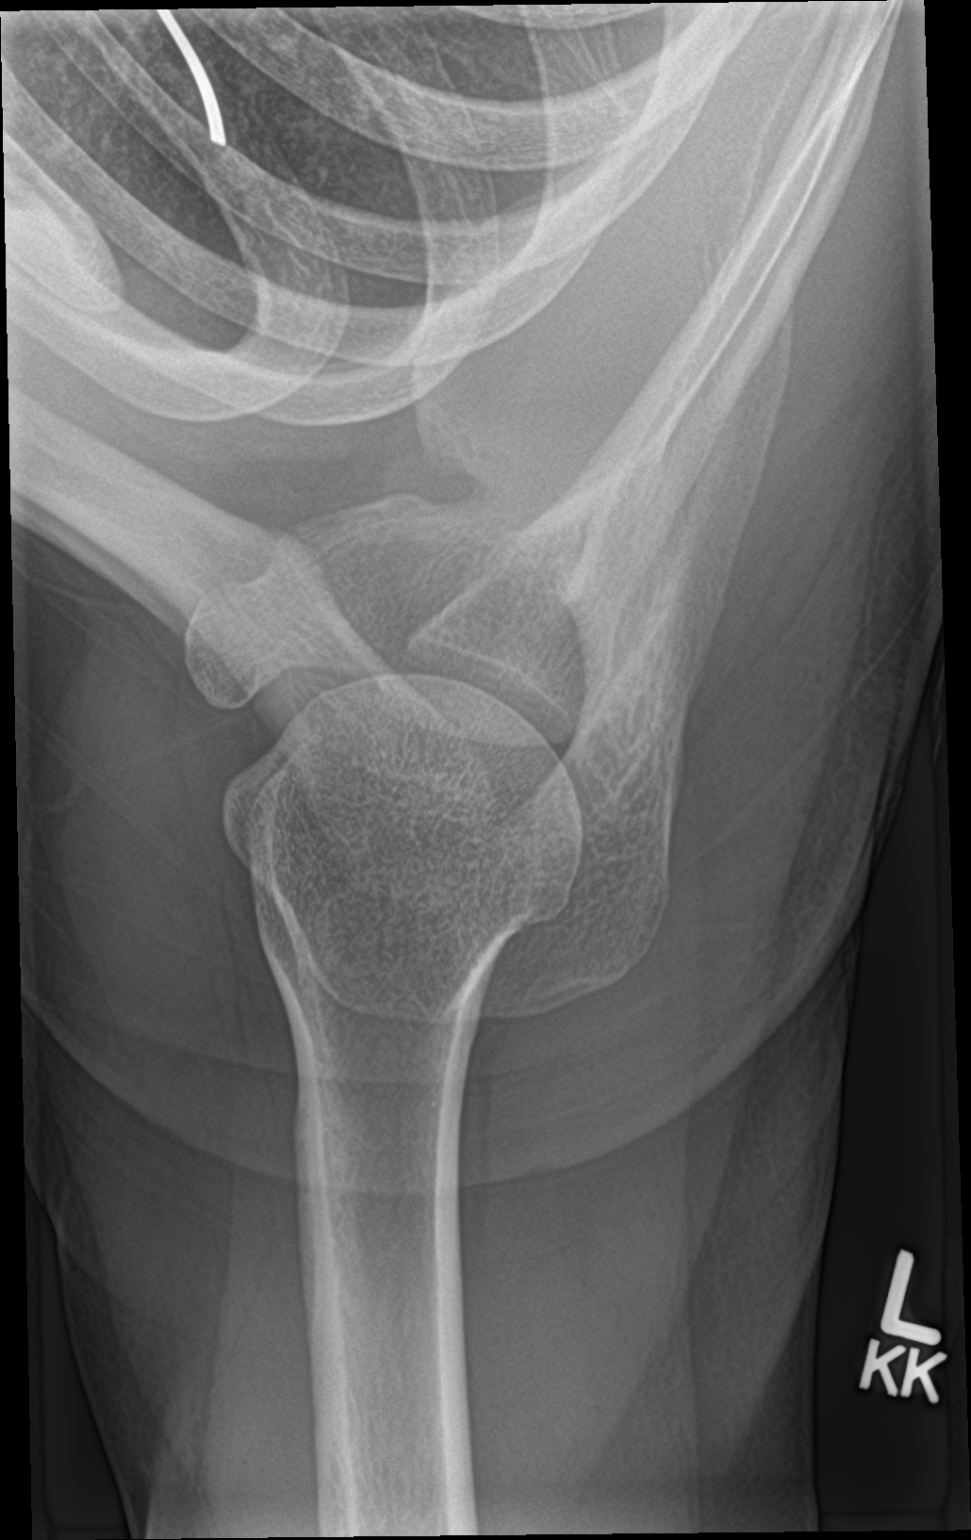

[3 of 3 positions shown; findings below may reference images not displayed]

FINDINGS: There is no evidence of fracture or dislocation. There is no
evidence of arthropathy or other focal bone abnormality. Soft
tissues are unremarkable.
IMPRESSION: Negative.

## 2021-05-20 ENCOUNTER — Ambulatory Visit
Admission: RE | Admit: 2021-05-20 | Discharge: 2021-05-20 | Disposition: A | Discharge status: Home / Self Care | Payer: Medicaid Other | Source: Ambulatory Visit | Attending: Physician Assistant | Admitting: Physician Assistant

## 2021-05-20 ENCOUNTER — Other Ambulatory Visit: Payer: Self-pay

## 2021-05-20 DIAGNOSIS — M5013 Cervical disc disorder with radiculopathy, cervicothoracic region: Secondary | ICD-10-CM | POA: Insufficient documentation

## 2021-05-29 ENCOUNTER — Other Ambulatory Visit (HOSPITAL_BASED_OUTPATIENT_CLINIC_OR_DEPARTMENT_OTHER): Payer: Self-pay | Admitting: Physician Assistant

## 2021-05-29 ENCOUNTER — Other Ambulatory Visit: Payer: Self-pay | Admitting: Physician Assistant

## 2021-05-29 DIAGNOSIS — R222 Localized swelling, mass and lump, trunk: Secondary | ICD-10-CM

## 2021-05-29 DIAGNOSIS — M5013 Cervical disc disorder with radiculopathy, cervicothoracic region: Secondary | ICD-10-CM

## 2021-06-05 ENCOUNTER — Other Ambulatory Visit: Payer: Self-pay

## 2021-06-05 ENCOUNTER — Encounter: Payer: Self-pay | Admitting: *Deleted

## 2021-06-05 DIAGNOSIS — M7989 Other specified soft tissue disorders: Secondary | ICD-10-CM | POA: Diagnosis not present

## 2021-06-05 DIAGNOSIS — G43909 Migraine, unspecified, not intractable, without status migrainosus: Secondary | ICD-10-CM | POA: Insufficient documentation

## 2021-06-05 DIAGNOSIS — Z5321 Procedure and treatment not carried out due to patient leaving prior to being seen by health care provider: Secondary | ICD-10-CM | POA: Diagnosis not present

## 2021-06-05 DIAGNOSIS — R519 Headache, unspecified: Secondary | ICD-10-CM | POA: Diagnosis present

## 2021-06-05 LAB — BASIC METABOLIC PANEL
Anion gap: 7 (ref 5–15)
BUN: 15 mg/dL (ref 6–20)
CO2: 24 mmol/L (ref 22–32)
Calcium: 9.4 mg/dL (ref 8.9–10.3)
Chloride: 105 mmol/L (ref 98–111)
Creatinine, Ser: 1.07 mg/dL — ABNORMAL HIGH (ref 0.44–1.00)
GFR, Estimated: >60 mL/min (ref >60)
Glucose, Bld: 97 mg/dL (ref 70–99)
Potassium: 3.9 mmol/L (ref 3.5–5.1)
Sodium: 136 mmol/L (ref 135–145)

## 2021-06-05 LAB — CBC
HCT: 36.4 % (ref 36.0–46.0)
Hemoglobin: 12.1 g/dL (ref 12.0–15.0)
MCH: 30.9 pg (ref 26.0–34.0)
MCHC: 33.2 g/dL (ref 30.0–36.0)
MCV: 92.9 fL (ref 80.0–100.0)
Platelets: 331 10*3/uL (ref 150–400)
RBC: 3.92 MIL/uL (ref 3.87–5.11)
RDW: 12.1 % (ref 11.5–15.5)
WBC: 5.6 10*3/uL (ref 4.0–10.5)
nRBC: 0 % (ref 0.0–0.2)

## 2021-06-05 NOTE — ED Triage Notes (Signed)
Pt ambulatory to triage with steady gait, reports heaviness in her shoulder, stiff neck, migraine headache "going on for 6 months". Says she now has nausea and unable to sleep. 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 Denies numbness or tingling, reports ongoing swelling in the hands.

## 2021-06-06 ENCOUNTER — Emergency Department
Admission: EM | Admit: 2021-06-06 | Discharge: 2021-06-06 | Discharge status: Against Medical Advice | Payer: Medicaid Other | Attending: Emergency Medicine | Admitting: Emergency Medicine

## 2021-06-11 ENCOUNTER — Ambulatory Visit
Admission: RE | Admit: 2021-06-11 | Discharge: 2021-06-11 | Disposition: A | Discharge status: Home / Self Care | Payer: Medicaid Other | Source: Ambulatory Visit | Attending: Physician Assistant | Admitting: Physician Assistant

## 2021-06-11 ENCOUNTER — Other Ambulatory Visit: Payer: Self-pay

## 2021-06-11 DIAGNOSIS — M5013 Cervical disc disorder with radiculopathy, cervicothoracic region: Secondary | ICD-10-CM | POA: Diagnosis not present

## 2021-06-11 DIAGNOSIS — R222 Localized swelling, mass and lump, trunk: Secondary | ICD-10-CM

## 2021-06-11 MED ORDER — GADOBUTROL 1 MMOL/ML IV SOLN
6.0000 mL | Freq: Once | INTRAVENOUS | Status: AC | PRN
  Administered 2021-06-11: 6 mL via INTRAVENOUS

## 2021-07-01 ENCOUNTER — Other Ambulatory Visit (HOSPITAL_COMMUNITY): Payer: Self-pay | Admitting: Neurosurgery

## 2021-07-01 ENCOUNTER — Other Ambulatory Visit: Payer: Self-pay | Admitting: Neurosurgery

## 2021-07-01 DIAGNOSIS — G935 Compression of brain: Secondary | ICD-10-CM

## 2021-07-01 DIAGNOSIS — D496 Neoplasm of unspecified behavior of brain: Secondary | ICD-10-CM

## 2021-07-02 ENCOUNTER — Other Ambulatory Visit: Payer: Self-pay | Admitting: Neurosurgery

## 2021-07-02 DIAGNOSIS — G950 Syringomyelia and syringobulbia: Secondary | ICD-10-CM

## 2021-07-02 DIAGNOSIS — G935 Compression of brain: Secondary | ICD-10-CM

## 2021-07-16 ENCOUNTER — Ambulatory Visit (HOSPITAL_COMMUNITY): Admission: RE | Admit: 2021-07-16 | Payer: Medicaid Other | Source: Ambulatory Visit

## 2021-07-16 ENCOUNTER — Other Ambulatory Visit: Payer: Self-pay

## 2021-07-16 ENCOUNTER — Ambulatory Visit (HOSPITAL_COMMUNITY)
Admission: RE | Admit: 2021-07-16 | Discharge: 2021-07-16 | Disposition: A | Discharge status: Home / Self Care | Payer: Medicaid Other | Source: Ambulatory Visit | Attending: Neurosurgery | Admitting: Neurosurgery

## 2021-07-16 ENCOUNTER — Encounter (HOSPITAL_COMMUNITY): Payer: Self-pay

## 2021-07-16 DIAGNOSIS — D496 Neoplasm of unspecified behavior of brain: Secondary | ICD-10-CM | POA: Insufficient documentation

## 2021-07-16 DIAGNOSIS — G950 Syringomyelia and syringobulbia: Secondary | ICD-10-CM | POA: Diagnosis present

## 2021-07-16 DIAGNOSIS — G935 Compression of brain: Secondary | ICD-10-CM | POA: Insufficient documentation

## 2021-07-16 MED ORDER — GADOBUTROL 1 MMOL/ML IV SOLN
6.0000 mL | Freq: Once | INTRAVENOUS | Status: AC | PRN
  Administered 2021-07-16: 6 mL via INTRAVENOUS

## 2021-10-11 DIAGNOSIS — Z978 Presence of other specified devices: Secondary | ICD-10-CM | POA: Insufficient documentation

## 2021-10-11 DIAGNOSIS — D649 Anemia, unspecified: Secondary | ICD-10-CM | POA: Insufficient documentation

## 2021-10-11 DIAGNOSIS — Z86018 Personal history of other benign neoplasm: Secondary | ICD-10-CM | POA: Insufficient documentation

## 2021-10-13 HISTORY — PX: GASTROSTOMY TUBE PLACEMENT: SHX655

## 2021-11-05 ENCOUNTER — Encounter: Payer: Self-pay | Admitting: Emergency Medicine

## 2021-11-05 ENCOUNTER — Emergency Department: Payer: Medicaid Other

## 2021-11-05 ENCOUNTER — Emergency Department
Admission: EM | Admit: 2021-11-05 | Discharge: 2021-11-05 | Disposition: A | Discharge status: Home / Self Care | Payer: Medicaid Other | Attending: Emergency Medicine | Admitting: Emergency Medicine

## 2021-11-05 ENCOUNTER — Other Ambulatory Visit: Payer: Self-pay

## 2021-11-05 DIAGNOSIS — R1032 Left lower quadrant pain: Secondary | ICD-10-CM | POA: Insufficient documentation

## 2021-11-05 DIAGNOSIS — Z85841 Personal history of malignant neoplasm of brain: Secondary | ICD-10-CM | POA: Diagnosis not present

## 2021-11-05 DIAGNOSIS — R1084 Generalized abdominal pain: Secondary | ICD-10-CM

## 2021-11-05 DIAGNOSIS — I10 Essential (primary) hypertension: Secondary | ICD-10-CM | POA: Insufficient documentation

## 2021-11-05 DIAGNOSIS — R1031 Right lower quadrant pain: Secondary | ICD-10-CM | POA: Diagnosis present

## 2021-11-05 LAB — URINALYSIS, ROUTINE W REFLEX MICROSCOPIC
Glucose, UA: NEGATIVE mg/dL
Hgb urine dipstick: NEGATIVE
Ketones, ur: 40 mg/dL — AB
Leukocytes,Ua: NEGATIVE
Nitrite: NEGATIVE
Protein, ur: 30 mg/dL — AB
Specific Gravity, Urine: >1.03 — ABNORMAL HIGH (ref 1.005–1.030)
pH: 5 (ref 5.0–8.0)

## 2021-11-05 LAB — CBC
HCT: 33.6 % — ABNORMAL LOW (ref 36.0–46.0)
Hemoglobin: 10.6 g/dL — ABNORMAL LOW (ref 12.0–15.0)
MCH: 29.6 pg (ref 26.0–34.0)
MCHC: 31.5 g/dL (ref 30.0–36.0)
MCV: 93.9 fL (ref 80.0–100.0)
Platelets: 249 10*3/uL (ref 150–400)
RBC: 3.58 MIL/uL — ABNORMAL LOW (ref 3.87–5.11)
RDW: 13.2 % (ref 11.5–15.5)
WBC: 4.1 10*3/uL (ref 4.0–10.5)
nRBC: 0 % (ref 0.0–0.2)

## 2021-11-05 LAB — COMPREHENSIVE METABOLIC PANEL
ALT: 19 U/L (ref 0–44)
AST: 16 U/L (ref 15–41)
Albumin: 4.5 g/dL (ref 3.5–5.0)
Alkaline Phosphatase: 41 U/L (ref 38–126)
Anion gap: 8 (ref 5–15)
BUN: 11 mg/dL (ref 6–20)
CO2: 27 mmol/L (ref 22–32)
Calcium: 9.4 mg/dL (ref 8.9–10.3)
Chloride: 103 mmol/L (ref 98–111)
Creatinine, Ser: 0.95 mg/dL (ref 0.44–1.00)
GFR, Estimated: >60 mL/min (ref >60)
Glucose, Bld: 79 mg/dL (ref 70–99)
Potassium: 3.7 mmol/L (ref 3.5–5.1)
Sodium: 138 mmol/L (ref 135–145)
Total Bilirubin: 0.6 mg/dL (ref 0.3–1.2)
Total Protein: 7.1 g/dL (ref 6.5–8.1)

## 2021-11-05 LAB — LIPASE, BLOOD: Lipase: 46 U/L (ref 11–51)

## 2021-11-05 MED ORDER — IOHEXOL 300 MG/ML  SOLN
100.0000 mL | Freq: Once | INTRAMUSCULAR | Status: AC | PRN
  Administered 2021-11-05: 80 mL via INTRAVENOUS
  Filled 2021-11-05: qty 100

## 2021-11-05 MED ORDER — ONDANSETRON HCL 4 MG/2ML IJ SOLN
4.0000 mg | Freq: Once | INTRAMUSCULAR | Status: AC
  Administered 2021-11-05: 4 mg via INTRAVENOUS
  Filled 2021-11-05: qty 2

## 2021-11-05 MED ORDER — MORPHINE SULFATE (PF) 4 MG/ML IV SOLN
4.0000 mg | Freq: Once | INTRAVENOUS | Status: AC
  Administered 2021-11-05: 4 mg via INTRAVENOUS
  Filled 2021-11-05: qty 1

## 2021-11-05 NOTE — Discharge Instructions (Signed)
Your abdominal CT and blood work were all reassuring.  Please follow-up with the doctors that are following you for your G-tube and your feeds.

## 2021-11-05 NOTE — ED Triage Notes (Signed)
First Nurse Note:  Recent brain surgery and G+Tube placement.  Arrives today C/O pain around G-tube site.  AAOx3.  Skin warm and dry no apparent distress

## 2021-11-05 NOTE — ED Provider Notes (Signed)
[facility] Provider Note    Event Date/Time   First MD Initiated Contact with Patient 11/05/21 1618     (approximate)   History   Abdominal Pain   HPI  Lisa Hurst is a 51 y.o. female with past medical history of Chiari I malformation, meningioma status post left craniectomy status post G-tube placement who presents with abdominal pain.  Patient had surgery for her meningioma at [place] this month and had a G-tube placed on 1/9 because she was not able to tolerate p.o. due to significant swelling after surgery.  She has had some issues with acid reflux since having a G-tube placed.  Over the last day has had abdominal pain.  Is cramping in nature and diffuse and sometimes feels like a knife stabbing her right over the G-tube site.  G-tube is otherwise been functioning normally.  Has not had vomiting.  No diarrhea or constipation.  No fevers chills denies urinary symptoms.    Past Medical History:  Diagnosis Date   Anxiety    Hypertension     Patient Active Problem List   Diagnosis Date Noted   Essential hypertension 06/24/2020   Chest pain with low risk for cardiac etiology 11/29/2019   Heart palpitations 11/29/2019   SOB (shortness of breath) on exertion 11/29/2019     Physical Exam  Triage Vital Signs: ED Triage Vitals  Enc Vitals Group     BP 11/05/21 1339 (!) 135/106     Pulse Rate 11/05/21 1339 88     Resp 11/05/21 1339 16     Temp 11/05/21 1339 98.1 F (36.7 C)     Temp Source 11/05/21 1339 Oral     SpO2 11/05/21 1339 100 %     Weight 11/05/21 1335 117 lb (53.1 kg)     Height 11/05/21 1335 5\' 4"  (1.626 m)     Head Circumference --      Peak Flow --      Pain Score 11/05/21 1335 0     Pain Loc --      Pain Edu? --      Excl. in [place]? --     Most recent vital signs: Vitals:   11/05/21 1806 11/05/21 1924  BP: (!) 147/102 (!) 144/98  Pulse: 80 77  Resp: 18 16  Temp:  98 F (36.7 C)  SpO2: 100% 99%     General: Awake, no  distress.  CV:  Good peripheral perfusion.  Resp:  Normal effort.  Abd:  No distention.  G-tube in place, no surrounding erythema, mild tenderness palpation in the bilateral lower quadrants without guarding Neuro:             Awake, Alert, Oriented x 3  Other:     ED Results / Procedures / Treatments  Labs (all labs ordered are listed, but only abnormal results are displayed) Labs Reviewed  CBC - Abnormal; Notable for the following components:      Result Value   RBC 3.58 (*)    Hemoglobin 10.6 (*)    HCT 33.6 (*)    All other components within normal limits  URINALYSIS, ROUTINE W REFLEX MICROSCOPIC - Abnormal; Notable for the following components:   APPearance CLEAR (*)    Specific Gravity, Urine >1.030 (*)    Bilirubin Urine SMALL (*)    Ketones, ur 40 (*)    Protein, ur 30 (*)    Bacteria, UA FEW (*)    All other components within normal  limits  LIPASE, BLOOD  COMPREHENSIVE METABOLIC PANEL     EKG     RADIOLOGY I reviewed the CT abdomen pelvis which has no acute pathology   PROCEDURES:    MEDICATIONS ORDERED IN ED: Medications  morphine (PF) 4 MG/ML injection 4 mg (4 mg Intravenous Given 11/05/21 1814)  ondansetron (ZOFRAN) injection 4 mg (4 mg Intravenous Given 11/05/21 1814)  iohexol (OMNIPAQUE) 300 MG/ML solution 100 mL (80 mLs Intravenous Contrast Given 11/05/21 1822)     IMPRESSION / MDM / ASSESSMENT AND PLAN / ED COURSE  I reviewed the triage vital signs and the nursing notes.                              Differential diagnosis includes, but is not limited to, acid reflux, appendicitis, diverticulitis, pancreatitis  51 year old female with recent G-tube placement due to being unable to tolerate p.o. after a craniectomy for meningioma status post VP shunting who presents with abdominal pain.  Cramping in nature without associated vomiting or diarrhea or fevers.  On exam she does have tenderness in the bilateral lower quadrants but abdomen is overall  benign.  I reviewed her labs which are assuring there is no leukocytosis and CMP and lipase are within normal limits.  Will obtain a CT abdomen pelvis.   Patient received morphine and Zofran for her pain.  CT abdomen pelvis reviewed by myself and read by radiology as no acute pathology G tube in the proper position.  On reassessment patient feeling much better.  Unclear what the etiology of her pain is but with her negative CAT scan and otherwise reassuring labs I think that she is appropriate for discharge.  I advised that she follow-up with the nutritionist who is managing her feeds as well as her primary care team.     FINAL CLINICAL IMPRESSION(S) / ED DIAGNOSES   Final diagnoses:  Generalized abdominal pain     Rx / DC Orders   ED Discharge Orders     None        Note:  This document was prepared using Dragon voice recognition software and may include unintentional dictation errors.   Rada Hay, MD 11/05/21 2104

## 2021-11-05 NOTE — ED Triage Notes (Signed)
Pt to ED via POV, pt states that she had a G tube placed at [facility] on 10/13/21. Pt states that last night she started having pain in her abdomen where her G tube is located. Pt reports severe, cramping pain. Pt states that she is unable to eat due to the pain and that when she gets the cramps in her abdomen it makes the tube stick out more. Pt also reports that one of the bottoms under her tube came off yesterday. Pt is in NAD at this time. Pt states that she is not in pain at this time.

## 2022-02-04 IMAGING — CT CT ABD-PELV W/ CM
2 of 5 series · 15 of 46 positions shown, 17 images · IV contrast (APPLIED)
Comparison: CT abdomen and pelvis dated February 03, 2017 chest

CLINICAL DATA: Abdominal pain

EXAM:
CT ABDOMEN AND PELVIS WITH CONTRAST
TECHNIQUE: Multidetector CT imaging of the abdomen and pelvis was performed
using the standard protocol following bolus administration of
intravenous contrast.

[Series 2: routine abd/pel with · axial · 0.61mm/px · z∈[-784,-429]mm · 12 of 81 slices shown, 14 images]
[im 5/81  soft-tissue]
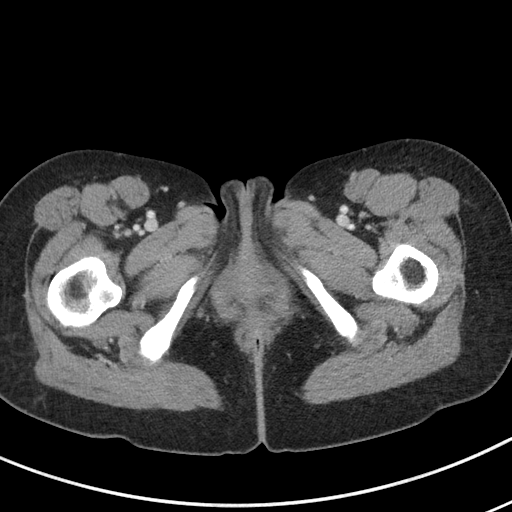
[im 5/81  bone]
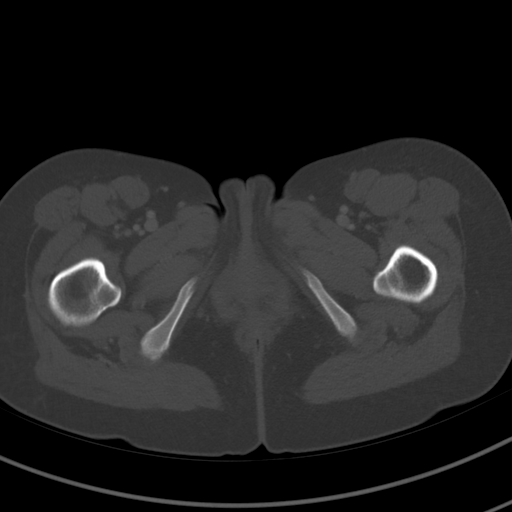
[im 14/81  soft-tissue]
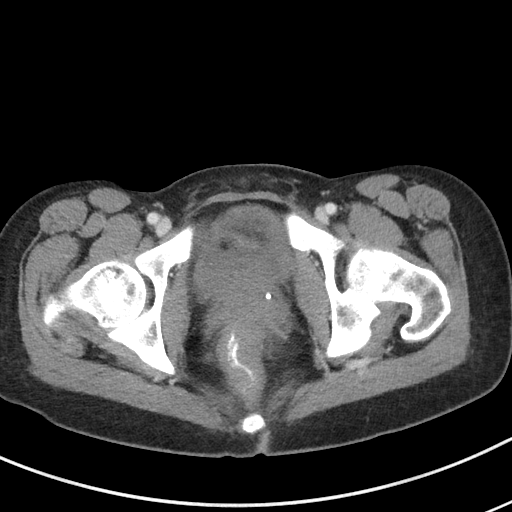
[im 18/81  soft-tissue]
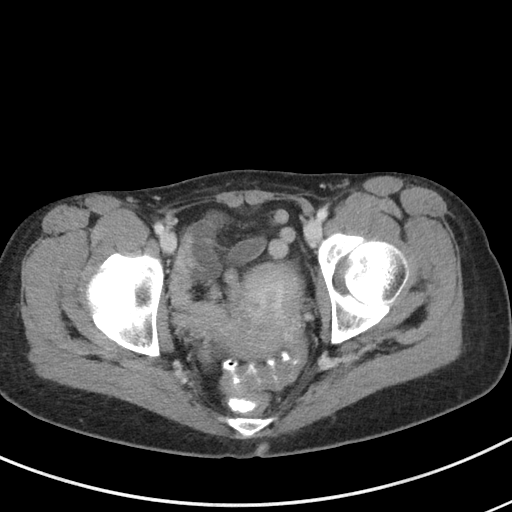
[im 23/81  soft-tissue]
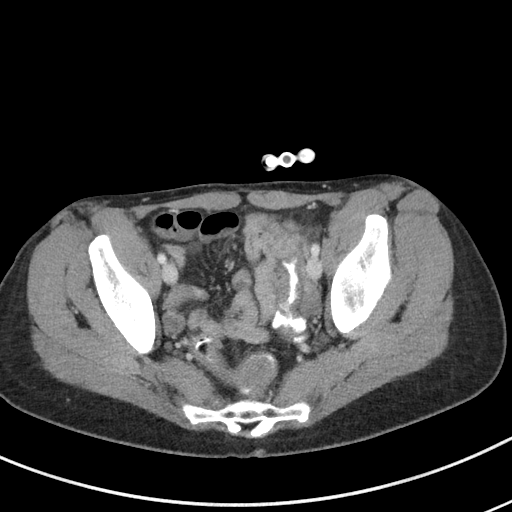
[im 32/81  soft-tissue]
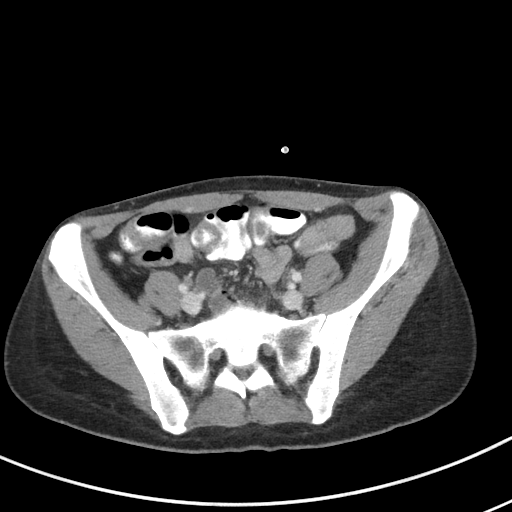
[im 36/81  soft-tissue]
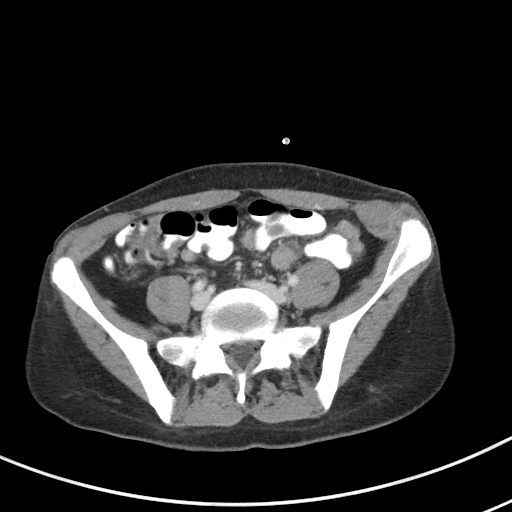
[im 45/81  soft-tissue]
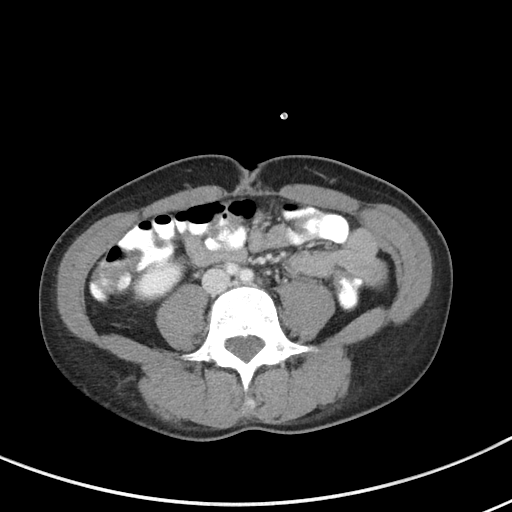
[im 49/81  soft-tissue]
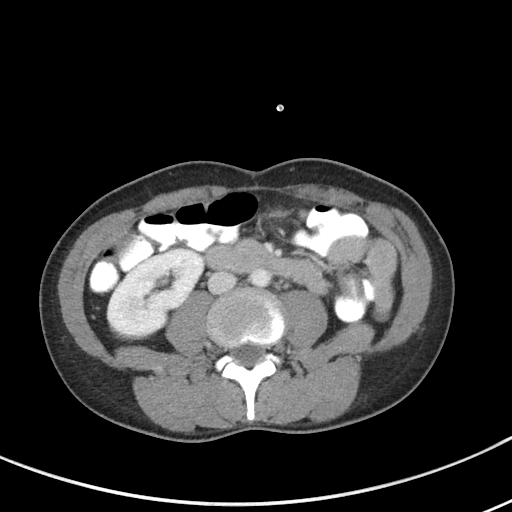
[im 58/81  soft-tissue]
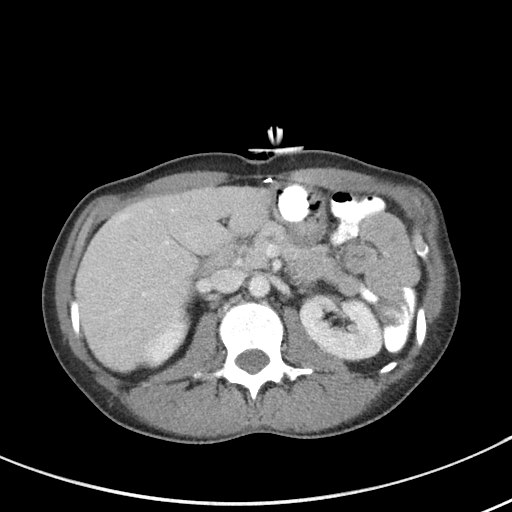
[im 58/81  bone]
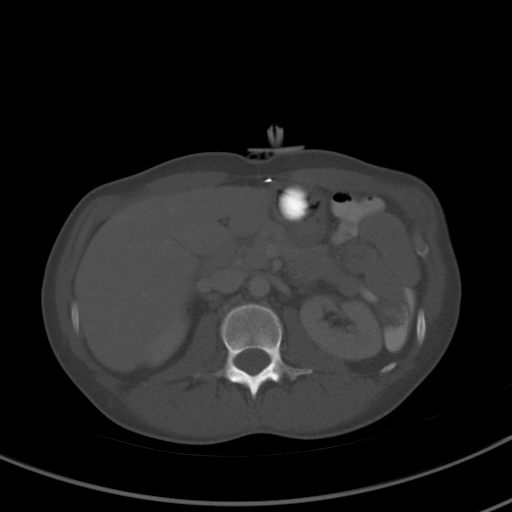
[im 63/81  soft-tissue]
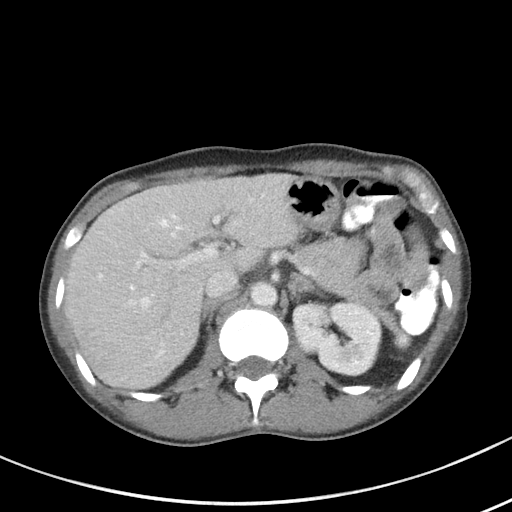
[im 67/81  soft-tissue]
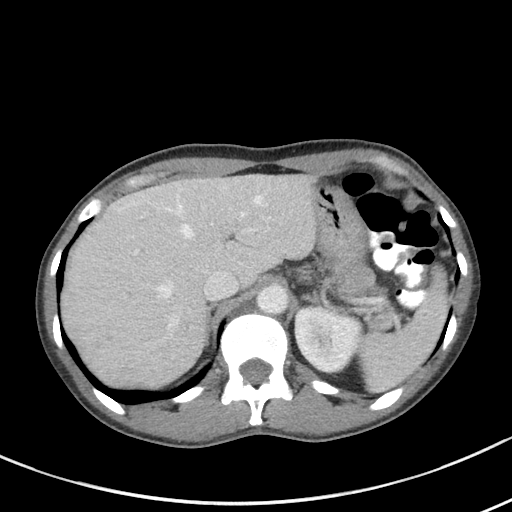
[im 76/81  soft-tissue]
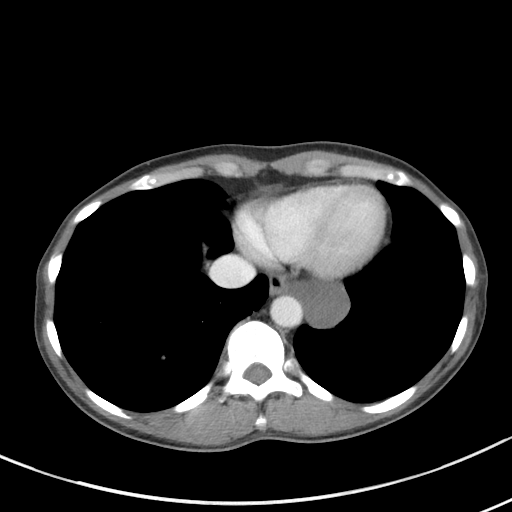

[Series 5: coronal st · coronal · 0.58mm/px · 3 of 67 slices shown]
[im 23/67  soft-tissue]
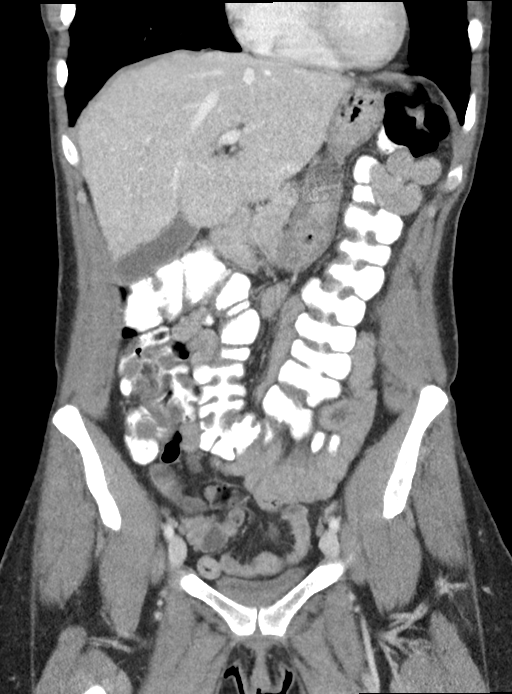
[im 30/67  soft-tissue]
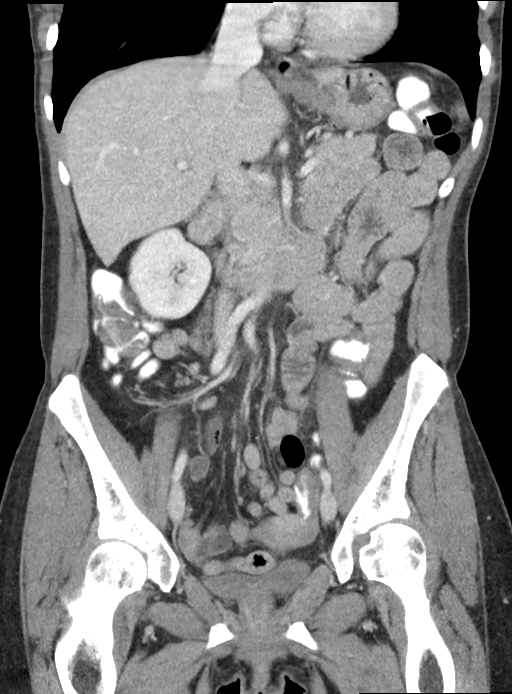
[im 37/67  soft-tissue]
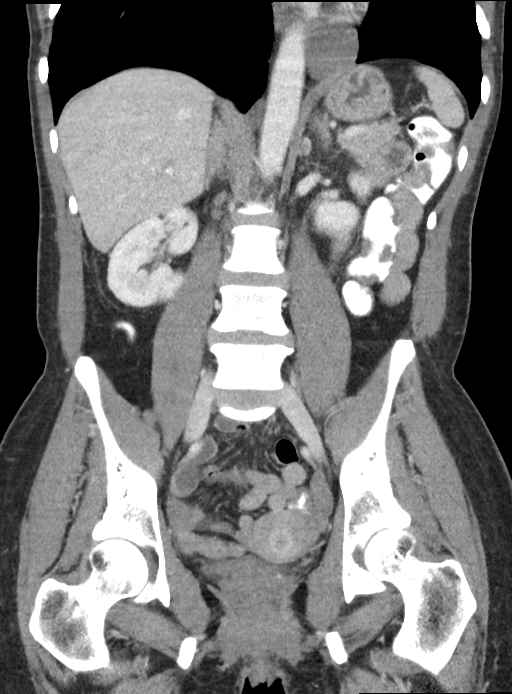

[15 of 46 positions shown; findings below may reference images not displayed]

RADIATION DOSE REDUCTION: This exam was performed according to the
departmental dose-optimization program which includes automated
exposure control, adjustment of the mA and/or kV according to
patient size and/or use of iterative reconstruction technique.

CONTRAST:  80mL OMNIPAQUE IOHEXOL 300 MG/ML  SOLN
FINDINGS: Lower chest: No acute abnormality. Low-density lesion adjacent to
the left esophagus and descending thoracic aorta measuring up to
cm on series 2 image 6, finding was seen on prior exam and is likely
likely a bronchogenic or enteric duplication cyst.

Hepatobiliary: No focal liver abnormality is seen. No gallstones,
gallbladder wall thickening, or biliary dilatation.

Pancreas: Unremarkable. No pancreatic ductal dilatation or
surrounding inflammatory changes.

Spleen: Normal in size without focal abnormality.

Adrenals/Urinary Tract: Adrenal glands are unremarkable. Kidneys are
normal, without renal calculi, focal lesion, or hydronephrosis.
Bladder is unremarkable.

Stomach/Bowel: Gastrostomy tube balloon is positioned within the
lumen of the stomach. Normal appendix. No evidence of bowel wall
thickening, obstruction, or inflammatory change.

Vascular/Lymphatic: No significant vascular findings are present. No
enlarged abdominal or pelvic lymph nodes.

Reproductive: Uterus and bilateral adnexa are unremarkable.

Other: No abdominal wall hernia or abnormality. No abdominopelvic
ascites.

Musculoskeletal: No acute or significant osseous findings.
IMPRESSION: 1. No acute findings in the abdomen or pelvis.
2. Gastrostomy tube is appropriately positioned with balloon
visualized within the lumen of the stomach.
3. Unchanged low-density lesion of the left middle mediastinum,
likely a bronchogenic or enteric duplication cyst.

## 2022-07-06 ENCOUNTER — Emergency Department: Payer: Medicaid Other

## 2022-07-06 ENCOUNTER — Emergency Department
Admission: EM | Admit: 2022-07-06 | Discharge: 2022-07-06 | Disposition: A | Discharge status: Home / Self Care | Payer: Medicaid Other | Attending: Emergency Medicine | Admitting: Emergency Medicine

## 2022-07-06 ENCOUNTER — Other Ambulatory Visit: Payer: Self-pay

## 2022-07-06 DIAGNOSIS — R0789 Other chest pain: Secondary | ICD-10-CM

## 2022-07-06 DIAGNOSIS — R053 Chronic cough: Secondary | ICD-10-CM | POA: Diagnosis not present

## 2022-07-06 LAB — CBC
HCT: 38.4 % (ref 36.0–46.0)
Hemoglobin: 12 g/dL (ref 12.0–15.0)
MCH: 27 pg (ref 26.0–34.0)
MCHC: 31.3 g/dL (ref 30.0–36.0)
MCV: 86.5 fL (ref 80.0–100.0)
Platelets: 383 10*3/uL (ref 150–400)
RBC: 4.44 MIL/uL (ref 3.87–5.11)
RDW: 15.1 % (ref 11.5–15.5)
WBC: 4.9 10*3/uL (ref 4.0–10.5)
nRBC: 0 % (ref 0.0–0.2)

## 2022-07-06 LAB — BASIC METABOLIC PANEL
Anion gap: 9 (ref 5–15)
BUN: 11 mg/dL (ref 6–20)
CO2: 23 mmol/L (ref 22–32)
Calcium: 9.6 mg/dL (ref 8.9–10.3)
Chloride: 107 mmol/L (ref 98–111)
Creatinine, Ser: 0.81 mg/dL (ref 0.44–1.00)
GFR, Estimated: >60 mL/min (ref >60)
Glucose, Bld: 94 mg/dL (ref 70–99)
Potassium: 3.5 mmol/L (ref 3.5–5.1)
Sodium: 139 mmol/L (ref 135–145)

## 2022-07-06 LAB — TROPONIN I (HIGH SENSITIVITY)
Troponin I (High Sensitivity): <2 ng/L (ref <18)
Troponin I (High Sensitivity): <2 ng/L (ref <18)

## 2022-07-06 MED ORDER — IOHEXOL 350 MG/ML SOLN
75.0000 mL | Freq: Once | INTRAVENOUS | Status: AC | PRN
  Administered 2022-07-06: 75 mL via INTRAVENOUS

## 2022-07-06 NOTE — ED Triage Notes (Signed)
C/O chest pain x 1 week.  States has been coughing for one week and pain is worse with cough..  States she has 'fillers' and left facial paralysis (pre existing) and she is still getting used to the 'fillers'.  AAOx3.  Skin warm and dry. No SOB/ DOE.  NAD

## 2022-07-06 NOTE — Discharge Instructions (Signed)
   Thank you for choosing us for your health care today!  Please see your primary doctor this week for a follow up appointment.   If you do not have a primary doctor call the following clinics to establish care:  If you have insurance:  [facility] [phone] [address] [place] [address]   [facility]  [phone] [address] [place] [address]   If you do not have insurance:  [facility]  [phone] [address] [place] [address]  Sometimes, in the early stages of certain disease courses it is difficult to detect in the emergency department evaluation -- so, it is important that you continue to monitor your symptoms and call your doctor right away or return to the emergency department if you develop any new or worsening symptoms.  It was my pleasure to care for you today.   Dezeray Puccio S. Kaymen Adrian, MD  

## 2022-07-06 NOTE — ED Provider Notes (Signed)
[facility] Provider Note    Event Date/Time   First MD Initiated Contact with Patient 07/06/22 0930     (approximate)   History   Chest Pain   HPI  Lisa Hurst is a 51 y.o. 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  No history of blood clots.  No shortness of breath.  No Fevers or chills.  History was obtained via the patient      Physical Exam   Triage Vital Signs: ED Triage Vitals  Enc Vitals Group     BP 07/06/22 0918 (!) 139/99     Pulse Rate 07/06/22 0918 76     Resp 07/06/22 0918 15     Temp 07/06/22 0918 98.2 F (36.8 C)     Temp Source 07/06/22 0918 Oral     SpO2 07/06/22 0918 96 %     Weight 07/06/22 0913 160 lb (72.6 kg)     Height 07/06/22 0913 '5\' 4"'$  (1.626 m)     Head Circumference --      Peak Flow --      Pain Score 07/06/22 0912 8     Pain Loc --      Pain Edu? --      Excl. in [place]? --     Most recent vital signs: Vitals:   07/06/22 0918 07/06/22 1104  BP: (!) 139/99 (!) 130/90  Pulse: 76 70  Resp: 15 16  Temp: 98.2 F (36.8 C)   SpO2: 96% 96%    General: Awake, no distress.  CV:  Good peripheral perfusion. Resp:  Normal effort. Clear to ascultation Abd:  No distention. Soft non tender Other:  Nontoxic-appearing, left-sided facial paralysis, which patient states is baseline.  HD reassuring normotension normal rate and no hypoxia   ED Results / Procedures / Treatments   Labs (all labs ordered are listed, but only abnormal results are displayed) Labs Reviewed  BASIC METABOLIC PANEL  CBC  TROPONIN I (HIGH SENSITIVITY)  TROPONIN I (HIGH SENSITIVITY)     I reviewed labs and they are notable for: X2  troponin neg  EKG  ED ECG REPORT I, Lucillie Garfinkel, the attending physician, personally viewed and interpreted this ECG.   Date: 07/06/2022  EKG Time: 0915  Rate: 71  Rhythm: normal EKG, normal sinus rhythm  Axis: normal  Intervals:TWI v1 v3 non specific   RADIOLOGY I independently reviewed and interpreted chest x-ray and see no obvious focal opacities or pneumothorax   PROCEDURES:  Critical Care performed: No  Procedures   MEDICATIONS ORDERED IN ED: Medications  iohexol (OMNIPAQUE) 350 MG/ML injection 75 mL (75 mLs Intravenous Contrast Given 07/06/22 1133)     IMPRESSION / MDM / ASSESSMENT AND PLAN / ED COURSE  I reviewed the triage vital signs and the nursing notes.                              Differential diagnosis includes, but is not limited to, chronic bronchitis, vocal cord irritation, pneumonia or viral illness, ACS, PE.  MDM: This is a well-appearing patient with a history of brain tumor who presents with new chest pain will assess for ACS with EKG  and troponins and CTA angiogram for PE.  Otherwise, may be due to vocal cord irritation for chronic cough, viral illness.    trop is negative x2 and EKG nonischemic.  CTA of the chest shows no emergent pathology.  Patient is comfortable and well-appearing, safe for discharge home with close PMD follow-up and return precautions given for worsening.  Considered viral testing, but results would not alter the course of management, discussed with patient and she is in agreement to defer viral testing at this time.  Patient's presentation is most consistent with acute presentation with potential threat to life or bodily function.       FINAL CLINICAL IMPRESSION(S) / ED DIAGNOSES   Final diagnoses:  Atypical chest pain  Chronic cough     Rx / DC Orders   ED Discharge Orders     None        Note:  This document was prepared using Dragon voice recognition software and may include unintentional dictation  errors.    Lucillie Garfinkel, MD 07/06/22 1256

## 2022-10-15 ENCOUNTER — Other Ambulatory Visit: Payer: Self-pay

## 2022-10-15 ENCOUNTER — Telehealth: Payer: Self-pay

## 2022-10-15 DIAGNOSIS — Z86010 Personal history of colonic polyps: Secondary | ICD-10-CM

## 2022-10-15 MED ORDER — SUFLAVE 178.7 G PO SOLR: 1.0000 | Freq: Once | ORAL | 0 refills | Status: AC

## 2022-10-15 NOTE — Telephone Encounter (Signed)
Gastroenterology Pre-Procedure Review  Request Date: 01/11/23 Requesting Physician: Dr. Vicente Males  PATIENT REVIEW QUESTIONS: The patient responded to the following health history questions as indicated:    1. Are you having any GI issues? no 2. Do you have a personal history of Polyps? yes (last colonoscopy performed by Dr. Vicente Males on 01/06/2021 ) 3. Do you have a family history of Colon Cancer or Polyps? yes (mother, sister colon polyps) 4. Diabetes Mellitus? no 5. Joint replacements in the past 12 months?no 6. Major health problems in the past 3 months? Brain surgery Jan 2024 7. Any artificial heart valves, MVP, or defibrillator?no    MEDICATIONS & ALLERGIES:    Patient reports the following regarding taking any anticoagulation/antiplatelet therapy:   Plavix, Coumadin, Eliquis, Xarelto, Lovenox, Pradaxa, Brilinta, or Effient? no Aspirin? no  Patient confirms/reports the following medications:  Current Outpatient Medications  Medication Sig Dispense Refill   ACETAMINOPHEN-BUTALBITAL 50-325 MG TABS      amLODipine (NORVASC) 5 MG tablet Take 5 mg by mouth daily.     busPIRone (BUSPAR) 15 MG tablet Take 15 mg by mouth 2 (two) times daily as needed.     citalopram (CELEXA) 40 MG tablet Take 40 mg by mouth daily.     losartan (COZAAR) 100 MG tablet Take 100 mg by mouth daily.     meloxicam (MOBIC) 15 MG tablet Take 15 mg by mouth daily.     montelukast (SINGULAIR) 10 MG tablet Take 10 mg by mouth daily.     White Petrolatum-Mineral Oil (SYSTANE NIGHTTIME) OINT Apply 1 application to eye 4 (four) times daily as needed.     No current facility-administered medications for this visit.    Patient confirms/reports the following allergies:  Allergies  Allergen Reactions   Other Anaphylaxis    Shell FIsh   Apple Juice    Erythromycin Hives    No orders of the defined types were placed in this encounter.   AUTHORIZATION INFORMATION Primary Insurance: 1D#: Group #:  Secondary  Insurance: 1D#: Group #:  SCHEDULE INFORMATION: Date: 01/11/23 Time: Location: [facility]

## 2022-10-22 ENCOUNTER — Encounter: Payer: Self-pay | Admitting: Gastroenterology

## 2023-01-11 ENCOUNTER — Encounter: Payer: Self-pay | Admitting: Certified Registered"

## 2023-01-11 ENCOUNTER — Ambulatory Visit
Admission: RE | Admit: 2023-01-11 | Discharge: 2023-01-11 | Disposition: A | Discharge status: Home / Self Care | Payer: Medicaid Other | Attending: Gastroenterology | Admitting: Gastroenterology

## 2023-01-11 ENCOUNTER — Encounter
Admission: RE | Disposition: A | Discharge status: Home / Self Care | Payer: Self-pay | Source: Home / Self Care | Attending: Gastroenterology

## 2023-01-11 SURGERY — COLONOSCOPY WITH PROPOFOL
Anesthesia: General

## 2023-01-22 ENCOUNTER — Ambulatory Visit: Payer: Medicaid Other | Admitting: Nurse Practitioner

## 2023-02-16 ENCOUNTER — Ambulatory Visit (INDEPENDENT_AMBULATORY_CARE_PROVIDER_SITE_OTHER): Payer: Medicaid Other | Admitting: Dermatology

## 2023-02-16 ENCOUNTER — Encounter: Payer: Self-pay | Admitting: Dermatology

## 2023-02-16 DIAGNOSIS — L810 Postinflammatory hyperpigmentation: Secondary | ICD-10-CM | POA: Diagnosis not present

## 2023-02-16 DIAGNOSIS — L700 Acne vulgaris: Secondary | ICD-10-CM

## 2023-02-16 DIAGNOSIS — L219 Seborrheic dermatitis, unspecified: Secondary | ICD-10-CM

## 2023-02-16 MED ORDER — CICLOPIROX 1 % EX SHAM: MEDICATED_SHAMPOO | CUTANEOUS | 2 refills | Status: DC

## 2023-02-16 MED ORDER — TRETINOIN 0.025 % EX CREA: TOPICAL_CREAM | CUTANEOUS | 2 refills | Status: DC

## 2023-02-16 MED ORDER — FLUOCINOLONE ACETONIDE SCALP 0.01 % EX OIL: TOPICAL_OIL | CUTANEOUS | 3 refills | Status: DC

## 2023-02-16 NOTE — Progress Notes (Signed)
New Patient Visit   Subjective  Lisa Hurst is a 52 y.o. female who presents for the following: Acne. Using Generic Duac. 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 Has lesion near scar. Was burning. Had nerve blocks 02/10/2023. Still has pain in area. Was diagnosed with seborrheic dermatitis in this area and advised to use Selsun Blue shampoo, caused burning. Has used apple cider vinegar. Has resolved since scheduling this appointment. Lasted 2 1/2 weeks.  Also has acne she would like addressed.   The following portions of the chart were reviewed this encounter and updated as appropriate: medications, allergies, medical history  Review of Systems:  No other skin or systemic complaints except as noted in HPI or Assessment and Plan.  Objective  Well appearing patient in no apparent distress; mood and affect are within normal limits.  A focused examination was performed of the following areas: Face and scalp  Relevant exam findings are noted in the Assessment and Plan.    Assessment & Plan   ACNE VULGARIS with PIH Exam: closed comedones, hyperpigmented macules.  Chronic and persistent condition with duration or expected duration over one year. Condition is symptomatic/ bothersome to patient. Not currently at goal.   Treatment Plan: Continue Clindamycin-Benzoyl Peroxide gel in mornings. start tretinoin 0.025% cream pea sized amount nightly to entire affected area.  Topical retinoid medications like tretinoin/Retin-A, adapalene/Differin, tazarotene/Fabior, and Epiduo/Epiduo Forte can cause dryness and irritation when first started. Only apply a pea-sized amount to the entire affected area. Avoid applying it around the eyes, edges of mouth and creases at the nose. If you experience irritation, use a good moisturizer first and/or apply the medicine less often. If you are doing well with the medicine, you can increase how often you use it until you are  applying every night. Be careful with sun protection while using this medication as it can make you sensitive to the sun. This medicine should not be used by pregnant women.   Recommend wearing sunscreen daily- samples given.   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  Chronic and persistent condition with duration or expected duration over one year. Condition is symptomatic/ bothersome to patient. Not currently at goal.  Seborrheic Dermatitis is a chronic persistent rash characterized by pinkness and scaling most commonly of the mid face but also can occur on the scalp (dandruff), ears; mid chest, mid back and groin.  It tends to be exacerbated by stress and cooler weather.  People who have neurologic disease may experience new onset or exacerbation of existing seborrheic dermatitis.  The condition is not curable but treatable and can be controlled.  Treatment Plan: Start Derma-Smoothe Scalp oil once or twice daily to affected areas on scalp as needed. Start Ciclopirox shampoo: lather on scalp, leave on 8-10 minutes, rinse well. Twice weekly  Topical steroids (such as triamcinolone, fluocinolone, fluocinonide, mometasone, clobetasol, halobetasol, betamethasone, hydrocortisone) can cause thinning and lightening of the skin if they are used for too long in the same area. Your physician has selected the right strength medicine for your problem and area affected on the body. Please use your medication only as directed by your physician to prevent side effects.     Return in about 10 weeks (around 04/27/2023) for Acne Follow Up, Seborrheic Dermatitis Follow Up.  I, Lawson Radar, CMA, am acting as scribe for  Willeen Niece, MD.   Documentation: I have reviewed the above documentation for accuracy and completeness, and I agree with the above.  Willeen Niece, MD

## 2023-02-16 NOTE — Patient Instructions (Addendum)
Acne/Face: Continue Clindamycin-Benzoyl Peroxide gel in mornings. Apply tretinoin 0.025% cream pea sized amount nightly to entire affected area.  Topical retinoid medications like tretinoin/Retin-A, adapalene/Differin, tazarotene/Fabior, and Epiduo/Epiduo Forte can cause dryness and irritation when first started. Only apply a pea-sized amount to the entire affected area. Avoid applying it around the eyes, edges of mouth and creases at the nose. If you experience irritation, use a good moisturizer first and/or apply the medicine less often. If you are doing well with the medicine, you can increase how often you use it until you are applying every night. Be careful with sun protection while using this medication as it can make you sensitive to the sun. This medicine should not be used by pregnant women.   Recommend wearing sunscreen daily.    Seb. Derm/Scalp: Start Derma-Smoothe Scalp oil once or twice daily to affected areas on scalp as needed. Start Ciclopirox shampoo: lather on scalp, leave on 8-10 minutes, rinse well.  Topical steroids (such as triamcinolone, fluocinolone, fluocinonide, mometasone, clobetasol, halobetasol, betamethasone, hydrocortisone) can cause thinning and lightening of the skin if they are used for too long in the same area. Your physician has selected the right strength medicine for your problem and area affected on the body. Please use your medication only as directed by your physician to prevent side effects.     Recommend daily broad spectrum sunscreen SPF 30+ to sun-exposed areas, reapply every 2 hours as needed. Call for new or changing lesions.  Staying in the shade or wearing long sleeves, sun glasses (UVA+UVB protection) and wide brim hats (4-inch brim around the entire circumference of the hat) are also recommended for sun protection.    Due to recent changes in healthcare laws, you may see results of your pathology and/or laboratory studies on MyChart before the  doctors have had a chance to review them. We understand that in some cases there may be results that are confusing or concerning to you. Please understand that not all results are received at the same time and often the doctors may need to interpret multiple results in order to provide you with the best plan of care or course of treatment. Therefore, we ask that you please give [place] 2 business days to thoroughly review all your results before contacting the office for clarification. Should we see a critical lab result, you will be contacted sooner.   If You Need Anything After Your Visit  If you have any questions or concerns for your doctor, please call our main line at [phone] and press option 4 to reach your doctor's medical assistant. If no one answers, please leave a voicemail as directed and we will return your call as soon as possible. Messages left after 4 pm will be answered the following business day.   You may also send [place] a message via MyChart. We typically respond to MyChart messages within 1-2 business days.  For prescription refills, please ask your pharmacy to contact our office. Our fax number is [phone].  If you have an urgent issue when the clinic is closed that cannot wait until the next business day, you can page your doctor at the number below.    Please note that while we do our best to be available for urgent issues outside of office hours, we are not available 24/7.   If you have an urgent issue and are unable to reach [place], you may choose to seek medical care at your doctor's office, retail clinic, urgent care center, or  emergency room.  If you have a medical emergency, please immediately call 911 or go to the emergency department.  Pager Numbers  - Dr. Nehemiah Massed: [phone]  - Dr. Laurence Ferrari: [phone]  - Dr. Nicole Kindred: [phone]  In the event of inclement weather, please call our main line at [phone] for an update on the status of any delays or  closures.  Dermatology Medication Tips: Please keep the boxes that topical medications come in in order to help keep track of the instructions about where and how to use these. Pharmacies typically print the medication instructions only on the boxes and not directly on the medication tubes.   If your medication is too expensive, please contact our office at [phone] option 4 or send [place] a message through [place].   We are unable to tell what your co-pay for medications will be in advance as this is different depending on your insurance coverage. However, we may be able to find a substitute medication at lower cost or fill out paperwork to get insurance to cover a needed medication.   If a prior authorization is required to get your medication covered by your insurance company, please allow [place] 1-2 business days to complete this process.  Drug prices often vary depending on where the prescription is filled and some pharmacies may offer cheaper prices.  The website www.goodrx.com contains coupons for medications through different pharmacies. The prices here do not account for what the cost may be with help from insurance (it may be cheaper with your insurance), but the website can give you the price if you did not use any insurance.  - You can print the associated coupon and take it with your prescription to the pharmacy.  - You may also stop by our office during regular business hours and pick up a GoodRx coupon card.  - If you need your prescription sent electronically to a different pharmacy, notify our office through [facility] or by phone at [phone] option 4.     Si Usted Necesita Algo Despus de Su Visita  Tambin puede enviarnos un mensaje a travs de Pharmacist, community. Por lo general respondemos a los mensajes de MyChart en el transcurso de 1 a 2 das hbiles.  Para renovar recetas, por favor pida a su farmacia que se ponga en contacto con nuestra oficina. Harland Dingwall de fax  es [place] [phone].  Si tiene un asunto urgente cuando la clnica est cerrada y que no puede esperar hasta el siguiente da hbil, puede llamar/localizar a su doctor(a) al nmero que aparece a continuacin.   Por favor, tenga en cuenta que aunque hacemos todo lo posible para estar disponibles para asuntos urgentes fuera del horario de [place], no estamos disponibles las 24 horas del da, los 7 das de la [place].   Si tiene un problema urgente y no puede comunicarse con nosotros, puede optar por buscar atencin mdica  en el consultorio de su doctor(a), en una clnica privada, en un centro de atencin urgente o en una sala de emergencias.  Si tiene Engineering geologist, por favor llame inmediatamente al 911 o vaya a la sala de emergencias.  Nmeros de bper  - Dr. Nehemiah Massed: [phone]  - Dra. Moye: [phone]  - Dra. Nicole Kindred: [phone]  En caso de inclemencias del [place], por favor llame a Johnsie Kindred principal al [phone] para una actualizacin sobre el [place] de cualquier retraso o cierre.  Consejos para la medicacin en dermatologa: Por favor, guarde las cajas en las que vienen  los medicamentos de uso tpico para ayudarle a seguir las instrucciones sobre dnde y cmo usarlos. Las farmacias generalmente imprimen las instrucciones del medicamento slo en las cajas y no directamente en los tubos del [place].   Si su medicamento es muy caro, por favor, pngase en contacto con Rolm Gala llamando al [phone] y presione la opcin 4 o envenos un mensaje a travs de Clinical cytogeneticist.   No podemos decirle cul ser su copago por los medicamentos por adelantado ya que esto es diferente dependiendo de la cobertura de su seguro. Sin embargo, es posible que podamos encontrar un medicamento sustituto a Audiological scientist un formulario para que el seguro cubra el medicamento que se considera necesario.   Si se requiere una autorizacin previa para que su compaa de seguros [place]  su medicamento, por favor permtanos de 1 a 2 das hbiles para completar [address]  Los precios de los medicamentos varan con frecuencia dependiendo del Environmental consultant de dnde se surte la receta y alguna farmacias pueden ofrecer precios ms baratos.  El sitio web www.goodrx.com tiene cupones para medicamentos de Health and safety inspector. Los precios aqu no tienen en cuenta lo que podra costar con la ayuda del seguro (puede ser ms barato con su seguro), pero el sitio web puede darle el precio si no utiliz Tourist information centre manager.  - Puede imprimir el cupn correspondiente y llevarlo con su receta a la farmacia.  - Tambin puede pasar por nuestra oficina durante el horario de atencin regular y Education officer, museum una tarjeta de cupones de GoodRx.  - Si necesita que su receta se enve electrnicamente a una farmacia diferente, informe a nuestra oficina a travs de MyChart de [place] o por telfono llamando al [phone] y presione la opcin 4.

## 2023-02-19 ENCOUNTER — Other Ambulatory Visit: Payer: Self-pay | Admitting: Dermatology

## 2023-03-08 ENCOUNTER — Encounter: Payer: Self-pay | Admitting: Dermatology

## 2023-03-10 ENCOUNTER — Ambulatory Visit (INDEPENDENT_AMBULATORY_CARE_PROVIDER_SITE_OTHER): Payer: Medicaid Other | Admitting: Dermatology

## 2023-03-10 VITALS — BP 143/96 | HR 95

## 2023-03-10 DIAGNOSIS — M792 Neuralgia and neuritis, unspecified: Secondary | ICD-10-CM | POA: Diagnosis not present

## 2023-03-10 DIAGNOSIS — L219 Seborrheic dermatitis, unspecified: Secondary | ICD-10-CM

## 2023-03-10 DIAGNOSIS — R208 Other disturbances of skin sensation: Secondary | ICD-10-CM | POA: Diagnosis not present

## 2023-03-10 MED ORDER — KETOCONAZOLE 2 % EX SHAM: MEDICATED_SHAMPOO | CUTANEOUS | 1 refills | Status: DC

## 2023-03-10 NOTE — Progress Notes (Signed)
   Follow-Up Visit   Subjective  Lisa Hurst is a 52 y.o. female who presents for the following: Spot behind left ear, started 3-4 days ago, painful, burning, and very sensitive to touch. This is the third time patient has experienced this. Area was improved at last visit. Was prescribed Derma-Smoothe oil and ciclopirox shampoo. She was not able to pick up either Rx. Oil has now been approved and she will pick up. 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 Has had nerve blocks recently. She has tried topical anesthetic creams, no improvement.   Patient accompanied by Clifton Custard, friend.   The following portions of the chart were reviewed this encounter and updated as appropriate: medications, allergies, medical history  Review of Systems:  No other skin or systemic complaints except as noted in HPI or Assessment and Plan.  Objective  Well appearing patient in no apparent distress; mood and affect are within normal limits.  A focused examination was performed of the following areas: Face, scalp  Relevant physical exam findings are noted in the Assessment and Plan.  Left Postauricular Area Linear white scar. Extreme pain per patient. Not able to touch area due to skin pain. No evidence of vesiculation or rash. No evidence of active shingles.        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 Condition is bothersome/symptomatic for patient. Currently flared.  Start Amitriptyline: 5%, Gabapentin: 10%, Lidocaine: 5% Cream Apply to AA BID dsp 45g. Rx sent to [facility].   Continue follow-up with neurologist for pain management.   SEBORRHEIC DERMATITIS vrs Seb Keratosis Exam: small hyperpigmented scaly plaque at occipital hair line behind ear, not able to touch area due to extreme pain from  neuropathy.   Chronic and persistent condition with duration or expected duration over one year. Condition is symptomatic/ bothersome to patient. Not currently at goal.   Seborrheic Dermatitis is a chronic persistent rash characterized by pinkness and scaling most commonly of the mid face but also can occur on the scalp (dandruff), ears; mid chest, mid back and groin.  It tends to be exacerbated by stress and cooler weather.  People who have neurologic disease may experience new onset or exacerbation of existing seborrheic dermatitis.  The condition is not curable but treatable and can be controlled.  Treatment Plan:  Start Derma-Smoothe Scalp oil once or twice daily to affected areas on scalp as needed. Rx ready at pharmacy. Can apply with medicine dropper  Start Ketoconazole 2% shampoo massage into AA scalp, let sit 10 minutes before rinsing dsp 120 mL 1Rf.    Return as scheduled, for Acne.  ICherlyn Labella, CMA, am acting as scribe for Willeen Niece, MD .   Documentation: I have reviewed the above documentation for accuracy and completeness, and I agree with the above.  Willeen Niece, MD

## 2023-03-10 NOTE — Patient Instructions (Addendum)
Over the counter (non-prescription) treatments for nerve pain include numbing creams like pramoxine or lidocaine which temporarily reduce itch or Capsaicin-containing creams which cause a burning sensation but which sometimes over time will reset the nerves to stop producing itch.  If you choose to use Capsaicin cream, it is recommended to use it 5 times daily for 1 week followed by 3 times daily for 3-6 weeks. You may have to continue using it long-term. - If not doing well with OTC options, could consider [facility] compounded prescription anti-itch cream with Amitriptyline 5% / Lidocaine 5% / Pramoxine 1% or Amitriptyline 5% / Gabapentin 10% / Lidocaine 5% Cream. For severe cases, there are some prescription cream or pill options which may help. Other treatment options include: - Transcutaneous Electrical Nerve Stimulation (TENS) - Gabapentin 300-900 mg daily po - Amitriptyline orally - Paravertebral local anesthetic block - intralesional Botulinum toxin A   Instructions for [facility] Medications  One or more of your medications was sent to the [facility] mail order compounding pharmacy. You will receive an email from them and can purchase the medicine through that link. It will then be mailed to your home at the address you confirmed. If for any reason you do not receive an email from them, please check your spam folder. If you still do not find the email, please let us know. [facility] phone number is [phone].  Due to recent changes in healthcare laws, you may see results of your pathology and/or laboratory studies on MyChart before the doctors have had a chance to review them. We understand that in some cases there may be results that are confusing or concerning to you. Please understand that not all results are received at the same time and often the doctors may need to interpret multiple results in order to provide you with the best plan of care or course of treatment.  Therefore, we ask that you please give [place] 2 business days to thoroughly review all your results before contacting the office for clarification. Should we see a critical lab result, you will be contacted sooner.    If You Need Anything After Your Visit  If you have any questions or concerns for your doctor, please call our main line at [phone] and press option 4 to reach your doctor's medical assistant. If no one answers, please leave a voicemail as directed and we will return your call as soon as possible. Messages left after 4 pm will be answered the following business day.   You may also send [place] a message via MyChart. We typically respond to MyChart messages within 1-2 business days.  For prescription refills, please ask your pharmacy to contact our office. Our fax number is [phone].  If you have an urgent issue when the clinic is closed that cannot wait until the next business day, you can page your doctor at the number below.    Please note that while we do our best to be available for urgent issues outside of office hours, we are not available 24/7.   If you have an urgent issue and are unable to reach [place], you may choose to seek medical care at your doctor's office, retail clinic, urgent care center, or emergency room.  If you have a medical emergency, please immediately call 911 or go to the emergency department.  Pager Numbers  - Dr. Gwen Pounds: [phone]  - Dr. Neale Burly: [phone]  - Dr. Roseanne Reno: [phone]  In the event of inclement weather, please  call our main line at [phone] for an update on the status of any delays or closures.  Dermatology Medication Tips: Please keep the boxes that topical medications come in in order to help keep track of the instructions about where and how to use these. Pharmacies typically print the medication instructions only on the boxes and not directly on the medication tubes.   If your medication is too expensive, please  contact our office at [phone] option 4 or send [place] a message through MyChart.   We are unable to tell what your co-pay for medications will be in advance as this is different depending on your insurance coverage. However, we may be able to find a substitute medication at lower cost or fill out paperwork to get insurance to cover a needed medication.   If a prior authorization is required to get your medication covered by your insurance company, please allow [place] 1-2 business days to complete this process.  Drug prices often vary depending on where the prescription is filled and some pharmacies may offer cheaper prices.  The website www.goodrx.com contains coupons for medications through different pharmacies. The prices here do not account for what the cost may be with help from insurance (it may be cheaper with your insurance), but the website can give you the price if you did not use any insurance.  - You can print the associated coupon and take it with your prescription to the pharmacy.  - You may also stop by our office during regular business hours and pick up a GoodRx coupon card.  - If you need your prescription sent electronically to a different pharmacy, notify our office through [facility] or by phone at [phone] option 4.     Si Usted Necesita Algo Despus de Su Visita  Tambin puede enviarnos un mensaje a travs de Clinical cytogeneticist. Por lo general respondemos a los mensajes de MyChart en el transcurso de 1 a 2 das hbiles.  Para renovar recetas, por favor pida a su farmacia que se ponga en contacto con nuestra oficina. Annie Sable de fax es [place] [phone].  Si tiene un asunto urgente cuando la clnica est cerrada y que no puede esperar hasta el siguiente da hbil, puede llamar/localizar a su doctor(a) al nmero que aparece a continuacin.   Por favor, tenga en cuenta que aunque hacemos todo lo posible para estar disponibles para asuntos urgentes fuera del horario de  [place], no estamos disponibles las 24 horas del da, los 7 [address]  [address] de la [place].   Si tiene un problema urgente y no puede comunicarse con nosotros, puede optar por buscar atencin mdica  en el consultorio de su doctor(a), en una clnica privada, en un centro de atencin urgente o en una sala de emergencias.  Si tiene Engineer, drilling, por favor llame inmediatamente al 911 o vaya a la sala de emergencias.  Nmeros de bper  - Dr. Gwen Pounds: [phone]  - Dra. Moye: [phone]  - Dra. Roseanne Reno: [phone]  En caso de inclemencias del [place], por favor llame a Lacy Duverney principal al [phone] para una actualizacin sobre el [place] de cualquier retraso o cierre.  Consejos para la medicacin en dermatologa: Por favor, guarde las cajas en las que vienen los medicamentos de uso tpico para ayudarle a seguir las instrucciones sobre dnde y cmo usarlos. Las farmacias generalmente imprimen las instrucciones del medicamento slo en las cajas y no directamente en los tubos del [place].   Si su medicamento es [employer], por  favor, pngase en contacto con nuestra oficina llamando al [phone] y presione la opcin 4 o envenos un mensaje a travs de Clinical cytogeneticist.   No podemos decirle cul ser su copago por los medicamentos por adelantado ya que esto es diferente dependiendo de la cobertura de su seguro. Sin embargo, es posible que podamos encontrar un medicamento sustituto a Audiological scientist un formulario para que el seguro cubra el medicamento que se considera necesario.   Si se requiere una autorizacin previa para que su compaa de seguros [place] su medicamento, por favor permtanos de 1 a 2 das hbiles para completar [address]  Los precios de los medicamentos varan con frecuencia dependiendo del Environmental consultant de dnde se surte la receta y alguna farmacias pueden ofrecer precios ms baratos.  El sitio web www.goodrx.com tiene cupones para medicamentos de Health and safety inspector. Los  precios aqu no tienen en cuenta lo que podra costar con la ayuda del seguro (puede ser ms barato con su seguro), pero el sitio web puede darle el precio si no utiliz Tourist information centre manager.  - Puede imprimir el cupn correspondiente y llevarlo con su receta a la farmacia.  - Tambin puede pasar por nuestra oficina durante el horario de atencin regular y Education officer, museum una tarjeta de cupones de GoodRx.  - Si necesita que su receta se enve electrnicamente a una farmacia diferente, informe a nuestra oficina a travs de MyChart de [place] o por telfono llamando al [phone] y presione la opcin 4.

## 2023-04-26 ENCOUNTER — Ambulatory Visit (INDEPENDENT_AMBULATORY_CARE_PROVIDER_SITE_OTHER): Payer: Medicaid Other | Admitting: Dermatology

## 2023-04-26 ENCOUNTER — Other Ambulatory Visit: Payer: Self-pay | Admitting: Dermatology

## 2023-04-26 ENCOUNTER — Encounter: Payer: Self-pay | Admitting: Dermatology

## 2023-04-26 VITALS — BP 149/99 | HR 97

## 2023-04-26 DIAGNOSIS — L219 Seborrheic dermatitis, unspecified: Secondary | ICD-10-CM

## 2023-04-26 DIAGNOSIS — L700 Acne vulgaris: Secondary | ICD-10-CM | POA: Diagnosis not present

## 2023-04-26 DIAGNOSIS — L810 Postinflammatory hyperpigmentation: Secondary | ICD-10-CM | POA: Diagnosis not present

## 2023-04-26 DIAGNOSIS — M792 Neuralgia and neuritis, unspecified: Secondary | ICD-10-CM | POA: Diagnosis not present

## 2023-04-26 MED ORDER — TRETINOIN 0.05 % EX GEL
CUTANEOUS | 5 refills | Status: AC
Start: 2023-04-26 — End: ?

## 2023-04-26 MED ORDER — TRETINOIN 0.05 % EX GEL: CUTANEOUS | 5 refills | Status: DC

## 2023-04-26 MED ORDER — FINACEA 15 % EX FOAM
CUTANEOUS | 5 refills | Status: DC
Start: 2023-04-26 — End: 2023-05-18

## 2023-04-26 NOTE — Progress Notes (Signed)
Follow-Up Visit   Subjective  Lisa Hurst is a 52 y.o. female who presents for the following:  here for 10 week acne, seb derm, and neuralgia involving scalp follow up. Patient reports neuralgia pain has gotten worst after recent nerve block. Patient has been unable to use prescribed cream and any topicals for scalp due to pain.  Also reports a recent acne flare and would like to discuss recommended products for oily skin.    The following portions of the chart were reviewed this encounter and updated as appropriate: medications, allergies, medical history  Review of Systems:  No other skin or systemic complaints except as noted in HPI or Assessment and Plan.  Objective  Well appearing patient in no apparent distress; mood and affect are within normal limits.   A focused examination was performed of the following areas: Scalp, face , left postauricular area   Relevant exam findings are noted in the Assessment and Plan.    Assessment & Plan   Neuralgia involving scalp Left Postauricular Area  Exam: mild hyperpigmentation at scar, no crusting or scabbing on left postauricular neck   Related to nerve injury from surgery   Chronic and persistent condition with duration or expected duration over one year. Condition is bothersome/symptomatic for patient. Currently flared.   More pain since last nerve block 20 days ago  Patient unable to use topical anesthetic at this time due to pain  Can restart cream once nerve block has worn off  Continue as needed Amitriptyline: 5%, Gabapentin: 10%, Lidocaine: 5% Cream Apply to AA BID     Continue follow-up with neurologist for pain management.     SEBORRHEIC DERMATITIS Exam: mild hyperpigmentation at scar, no crusting or scabbing on left postauricular neck   Chronic condition with duration or expected duration over one year. Currently well-controlled.  Seborrheic Dermatitis is a chronic persistent rash characterized by pinkness  and scaling most commonly of the mid face but also can occur on the scalp (dandruff), ears; mid chest, mid back and groin.  It tends to be exacerbated by stress and cooler weather.  People who have neurologic disease may experience new onset or exacerbation of existing seborrheic dermatitis.  The condition is not curable but treatable and can be controlled.   Treatment Plan: Patient unable to use at this time due to recent nerve block pain, will restart topicals when able.  Continue Derma-Smoothe Scalp as needed for flares oil once or twice daily to affected areas on scalp as needed. Rx ready at pharmacy. Can apply with medicine dropper  Continue Ketoconazole 2% shampoo as needed for flares massage into AA scalp, let sit 10 minutes before rinsing   ACNE VULGARIS with PIH Exam: hyperpigmented macules and resolving inflammatory papules on forehead  Chronic and persistent condition with duration or expected duration over one year. Condition is symptomatic/ bothersome to patient. Not currently at goal.     Treatment Plan:  Stop Clindamycin-tretinoin gel   Start Finacea 15% foam - apply topically to face qd/bid qam for acne  Increase tretinoin 0.025% to tretinoin 0.05% cream pea sized amount nightly to entire affected area.   Topical retinoid medications like tretinoin/Retin-A, adapalene/Differin, tazarotene/Fabior, and Epiduo/Epiduo Forte can cause dryness and irritation when first started. Only apply a pea-sized amount to the entire affected area. Avoid applying it around the eyes, edges of mouth and creases at the nose. If you experience irritation, use a good moisturizer first and/or apply the medicine less often. If you are  doing well with the medicine, you can increase how often you use it until you are applying every night. Be careful with sun protection while using this medication as it can make you sensitive to the sun. This medicine should not be used by pregnant women.    Continue  wearing sunscreen daily, oily skin cleanser samples given.   Acne vulgaris  Related Medications tretinoin (ALTRALIN) 0.05 % gel Apply pea sized amount nightly to face for acne  Azelaic Acid (FINACEA) 15 % FOAM Apply topically to face qd/bid qam for acne  tretinoin (ALTRALIN) 0.05 % gel Apply topically to face qam for acne. If flared can use twice daily.    Return in about 6 months (around 10/27/2023) for acne.  I, Asher Muir, CMA, am acting as scribe for Willeen Niece, MD.   Documentation: I have reviewed the above documentation for accuracy and completeness, and I agree with the above.  Willeen Niece, MD

## 2023-04-26 NOTE — Patient Instructions (Addendum)
Topical retinoid medications like tretinoin/Retin-A, adapalene/Differin, tazarotene/Fabior, and Epiduo/Epiduo Forte can cause dryness and irritation when first started. Only apply a pea-sized amount to the entire affected area. Avoid applying it around the eyes, edges of mouth and creases at the nose. If you experience irritation, use a good moisturizer first and/or apply the medicine less often. If you are doing well with the medicine, you can increase how often you use it until you are applying every night. Be careful with sun protection while using this medication as it can make you sensitive to the sun. This medicine should not be used by pregnant women.    Due to recent changes in healthcare laws, you may see results of your pathology and/or laboratory studies on MyChart before the doctors have had a chance to review them. We understand that in some cases there may be results that are confusing or concerning to you. Please understand that not all results are received at the same time and often the doctors may need to interpret multiple results in order to provide you with the best plan of care or course of treatment. Therefore, we ask that you please give [place] 2 business days to thoroughly review all your results before contacting the office for clarification. Should we see a critical lab result, you will be contacted sooner.   If You Need Anything After Your Visit  If you have any questions or concerns for your doctor, please call our main line at [phone] and press option 4 to reach your doctor's medical assistant. If no one answers, please leave a voicemail as directed and we will return your call as soon as possible. Messages left after 4 pm will be answered the following business day.   You may also send [place] a message via [place]. We typically respond to MyChart messages within 1-2 business days.  For prescription refills, please ask your pharmacy to contact our office. Our fax number is  [phone].  If you have an urgent issue when the clinic is closed that cannot wait until the next business day, you can page your doctor at the number below.    Please note that while we do our best to be available for urgent issues outside of office hours, we are not available 24/7.   If you have an urgent issue and are unable to reach [place], you may choose to seek medical care at your doctor's office, retail clinic, urgent care center, or emergency room.  If you have a medical emergency, please immediately call 911 or go to the emergency department.  Pager Numbers  - Dr. Nehemiah Massed: [phone]  - Dr. Laurence Ferrari: [phone]  - Dr. Nicole Kindred: [phone]  In the event of inclement weather, please call our main line at [phone] for an update on the status of any delays or closures.  Dermatology Medication Tips: Please keep the boxes that topical medications come in in order to help keep track of the instructions about where and how to use these. Pharmacies typically print the medication instructions only on the boxes and not directly on the medication tubes.   If your medication is too expensive, please contact our office at [phone] option 4 or send [place] a message through [place].   We are unable to tell what your co-pay for medications will be in advance as this is different depending on your insurance coverage. However, we may be able to find a substitute medication at lower cost or fill out paperwork to get insurance to cover a  needed medication.   If a prior authorization is required to get your medication covered by your insurance company, please allow [place] 1-2 business days to complete this process.  Drug prices often vary depending on where the prescription is filled and some pharmacies may offer cheaper prices.  The website www.goodrx.com contains coupons for medications through different pharmacies. The prices here do not account for what the cost may be with help from  insurance (it may be cheaper with your insurance), but the website can give you the price if you did not use any insurance.  - You can print the associated coupon and take it with your prescription to the pharmacy.  - You may also stop by our office during regular business hours and pick up a GoodRx coupon card.  - If you need your prescription sent electronically to a different pharmacy, notify our office through [facility] or by phone at [phone] option 4.     Si Usted Necesita Algo Despus de Su Visita  Tambin puede enviarnos un mensaje a travs de Pharmacist, community. Por lo general respondemos a los mensajes de MyChart en el transcurso de 1 a 2 das hbiles.  Para renovar recetas, por favor pida a su farmacia que se ponga en contacto con nuestra oficina. Harland Dingwall de fax es [place] [phone].  Si tiene un asunto urgente cuando la clnica est cerrada y que no puede esperar hasta el siguiente da hbil, puede llamar/localizar a su doctor(a) al nmero que aparece a continuacin.   Por favor, tenga en cuenta que aunque hacemos todo lo posible para estar disponibles para asuntos urgentes fuera del horario de [place], no estamos disponibles las 24 horas del da, los 7 das de la [place].   Si tiene un problema urgente y no puede comunicarse con nosotros, puede optar por buscar atencin mdica  en el consultorio de su doctor(a), en una clnica privada, en un centro de atencin urgente o en una sala de emergencias.  Si tiene Engineering geologist, por favor llame inmediatamente al 911 o vaya a la sala de emergencias.  Nmeros de bper  - Dr. Nehemiah Massed: [phone]  - Dra. Moye: [phone]  - Dra. Nicole Kindred: [phone]  En caso de inclemencias del [place], por favor llame a Johnsie Kindred principal al [phone] para una actualizacin sobre el [place] de cualquier retraso o cierre.  Consejos para la medicacin en dermatologa: Por favor, guarde las cajas en las que vienen los  medicamentos de uso tpico para ayudarle a seguir las instrucciones sobre dnde y cmo usarlos. Las farmacias generalmente imprimen las instrucciones del medicamento slo en las cajas y no directamente en los tubos del [place].   Si su medicamento es muy caro, por favor, pngase en contacto con Zigmund Daniel llamando al [phone] y presione la opcin 4 o envenos un mensaje a travs de Pharmacist, community.   No podemos decirle cul ser su copago por los medicamentos por adelantado ya que esto es diferente dependiendo de la cobertura de su seguro. Sin embargo, es posible que podamos encontrar un medicamento sustituto a Electrical engineer un formulario para que el seguro cubra el medicamento que se considera necesario.   Si se requiere una autorizacin previa para que su compaa de seguros [place] su medicamento, por favor permtanos de 1 a 2 das hbiles para completar este proceso.  Los precios de los medicamentos varan con frecuencia dependiendo del Environmental consultant de dnde se surte la receta y alguna farmacias pueden ofrecer precios ms baratos.  El sitio web www.goodrx.com tiene cupones para medicamentos de diferentes farmacias. Los precios aqu no tienen en cuenta lo que podra costar con la ayuda del seguro (puede ser ms barato con su seguro), pero el sitio web puede darle el precio si no utiliz ningn seguro.  - Puede imprimir el cupn correspondiente y llevarlo con su receta a la farmacia.  - Tambin puede pasar por nuestra oficina durante el horario de atencin regular y recoger una tarjeta de cupones de GoodRx.  - Si necesita que su receta se enve electrnicamente a una farmacia diferente, informe a nuestra oficina a travs de MyChart de [place] o por telfono llamando al [phone] y presione la opcin 4.  

## 2023-05-18 ENCOUNTER — Encounter: Payer: Self-pay | Admitting: Dermatology

## 2023-05-18 MED ORDER — AZELAIC ACID 15 % EX GEL: 1.0000 | Freq: Two times a day (BID) | CUTANEOUS | 0 refills | Status: DC

## 2023-06-17 ENCOUNTER — Encounter: Payer: Self-pay | Admitting: Emergency Medicine

## 2023-06-17 ENCOUNTER — Emergency Department: Payer: Medicaid Other

## 2023-06-17 ENCOUNTER — Other Ambulatory Visit
Admission: RE | Admit: 2023-06-17 | Discharge: 2023-06-17 | Disposition: A | Discharge status: Home / Self Care | Payer: Medicaid Other | Source: Ambulatory Visit | Attending: Family Medicine | Admitting: Family Medicine

## 2023-06-17 ENCOUNTER — Emergency Department
Admission: EM | Admit: 2023-06-17 | Discharge: 2023-06-17 | Disposition: A | Discharge status: Home / Self Care | Payer: Medicaid Other | Attending: Emergency Medicine | Admitting: Emergency Medicine

## 2023-06-17 ENCOUNTER — Other Ambulatory Visit: Payer: Self-pay

## 2023-06-17 DIAGNOSIS — R7989 Other specified abnormal findings of blood chemistry: Secondary | ICD-10-CM | POA: Insufficient documentation

## 2023-06-17 DIAGNOSIS — R079 Chest pain, unspecified: Secondary | ICD-10-CM | POA: Insufficient documentation

## 2023-06-17 DIAGNOSIS — M791 Myalgia, unspecified site: Secondary | ICD-10-CM | POA: Diagnosis present

## 2023-06-17 DIAGNOSIS — M7989 Other specified soft tissue disorders: Secondary | ICD-10-CM | POA: Diagnosis present

## 2023-06-17 DIAGNOSIS — I10 Essential (primary) hypertension: Secondary | ICD-10-CM | POA: Diagnosis not present

## 2023-06-17 LAB — BRAIN NATRIURETIC PEPTIDE
B Natriuretic Peptide: 25.3 pg/mL (ref 0.0–100.0)
B Natriuretic Peptide: 21.6 pg/mL (ref 0.0–100.0)

## 2023-06-17 LAB — TROPONIN I (HIGH SENSITIVITY)
Troponin I (High Sensitivity): 3 ng/L (ref <18)
Troponin I (High Sensitivity): 3 ng/L (ref <18)

## 2023-06-17 LAB — BASIC METABOLIC PANEL
Anion gap: 12 (ref 5–15)
BUN: 10 mg/dL (ref 6–20)
CO2: 26 mmol/L (ref 22–32)
Calcium: 9.6 mg/dL (ref 8.9–10.3)
Chloride: 102 mmol/L (ref 98–111)
Creatinine, Ser: 0.83 mg/dL (ref 0.44–1.00)
GFR, Estimated: >60 mL/min (ref >60)
Glucose, Bld: 86 mg/dL (ref 70–99)
Potassium: 3.6 mmol/L (ref 3.5–5.1)
Sodium: 140 mmol/L (ref 135–145)

## 2023-06-17 LAB — CBC
HCT: 38.1 % (ref 36.0–46.0)
Hemoglobin: 11.8 g/dL — ABNORMAL LOW (ref 12.0–15.0)
MCH: 27.6 pg (ref 26.0–34.0)
MCHC: 31 g/dL (ref 30.0–36.0)
MCV: 89 fL (ref 80.0–100.0)
Platelets: 388 10*3/uL (ref 150–400)
RBC: 4.28 MIL/uL (ref 3.87–5.11)
RDW: 13.8 % (ref 11.5–15.5)
WBC: 6.6 10*3/uL (ref 4.0–10.5)
nRBC: 0 % (ref 0.0–0.2)

## 2023-06-17 LAB — URINALYSIS, ROUTINE W REFLEX MICROSCOPIC
Bilirubin Urine: NEGATIVE
Glucose, UA: NEGATIVE mg/dL
Hgb urine dipstick: NEGATIVE
Ketones, ur: NEGATIVE mg/dL
Leukocytes,Ua: NEGATIVE
Nitrite: NEGATIVE
Protein, ur: NEGATIVE mg/dL
Specific Gravity, Urine: 1.013 (ref 1.005–1.030)
pH: 6 (ref 5.0–8.0)

## 2023-06-17 LAB — CK: Total CK: 136 U/L (ref 38–234)

## 2023-06-17 LAB — D-DIMER, QUANTITATIVE
D-Dimer, Quant: 0.75 ug{FEU}/mL — ABNORMAL HIGH (ref 0.00–0.50)
D-Dimer, Quant: 0.75 ug{FEU}/mL — ABNORMAL HIGH (ref 0.00–0.50)

## 2023-06-17 MED ORDER — IOHEXOL 350 MG/ML SOLN
75.0000 mL | Freq: Once | INTRAVENOUS | Status: AC | PRN
  Administered 2023-06-17: 75 mL via INTRAVENOUS

## 2023-06-17 MED ORDER — ACETAMINOPHEN 325 MG PO TABS
650.0000 mg | ORAL_TABLET | Freq: Once | ORAL | Status: AC
  Administered 2023-06-17: 650 mg via ORAL
  Filled 2023-06-17: qty 2

## 2023-06-17 NOTE — ED Notes (Signed)
Patient in CT

## 2023-06-17 NOTE — ED Provider Notes (Signed)
[facility] Provider Note    Event Date/Time   First MD Initiated Contact with Patient 06/17/23 1236     (approximate)   History   Abnormal Lab   HPI  Lisa Hurst is a 52 y.o. female who presents today for evaluation of diffuse bodyaches and swelling in her hands and feet.  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  No other significant past medical history.  She reports that she has had diffuse aches everywhere in her body for the past 7 to 10 days.  No fevers.  She had a negative COVID test at [facility] clinic.  She was sent here because she has a positive D-dimer.  She denies any pain in her calves or in her arms specifically, as of breath.  She reports that her sister had a blood clot.  Patient Active Problem List   Diagnosis Date Noted   Essential hypertension 06/24/2020   Chest pain with low risk for cardiac etiology 11/29/2019   Heart palpitations 11/29/2019   SOB (shortness of breath) on exertion 11/29/2019          Physical Exam   Triage Vital Signs: ED Triage Vitals  Encounter Vitals Group     BP 06/17/23 1217 (!) 167/99     Systolic BP Percentile --      Diastolic BP Percentile --      Pulse Rate 06/17/23 1217 (!) 107     Resp 06/17/23 1217 16     Temp 06/17/23 1217 98.3 F (36.8 C)     Temp Source 06/17/23 1217 Oral     SpO2 06/17/23 1217 97 %     Weight 06/17/23 1218 196 lb (88.9 kg)     Height 06/17/23 1218 5\' 4"  (1.626 m)     Head Circumference --      Peak Flow --      Pain Score 06/17/23 1218 10     Pain Loc --      Pain Education --      Exclude from Growth Chart --     Most recent vital signs: Vitals:   06/17/23 1217  BP: (!) 167/99  Pulse: (!) 107  Resp: 16  Temp: 98.3 F (36.8 C)  SpO2: 97%    Physical Exam Vitals and nursing note reviewed.  Constitutional:      General: Awake and alert. No acute  distress.    Appearance: Normal appearance. The patient is normal weight.  HENT:     Head: Normocephalic and atraumatic.     Mouth: Mucous membranes are moist.  Eyes:     General: PERRL. Normal EOMs        Right eye: No discharge.        Left eye: No discharge.     Conjunctiva/sclera: Conjunctivae normal.  Cardiovascular:     Rate and Rhythm: Tachycardic rate and regular rhythm.     Pulses: Normal pulses.  Pulmonary:     Effort: Pulmonary effort is normal. No respiratory distress.     Breath sounds: Normal breath sounds.  Abdominal:     Abdomen is soft. There is no abdominal tenderness. No rebound or guarding. No distention. Musculoskeletal:        General: No swelling. Normal range of motion.     Cervical back: Normal range of motion and neck supple.  Skin:    General: Skin is warm  and dry.     Capillary Refill: Capillary refill takes less than 2 seconds.     Findings: No rash.  Neurological:     Mental Status: The patient is awake and alert.      ED Results / Procedures / Treatments   Labs (all labs ordered are listed, but only abnormal results are displayed) Labs Reviewed  CBC - Abnormal; Notable for the following components:      Result Value   Hemoglobin 11.8 (*)    All other components within normal limits  D-DIMER, QUANTITATIVE - Abnormal; Notable for the following components:   D-Dimer, Quant 0.75 (*)    All other components within normal limits  BASIC METABOLIC PANEL  BRAIN NATRIURETIC PEPTIDE  CK  URINALYSIS, ROUTINE W REFLEX MICROSCOPIC  TROPONIN I (HIGH SENSITIVITY)  TROPONIN I (HIGH SENSITIVITY)     EKG     RADIOLOGY     PROCEDURES:  Critical Care performed:   Procedures   MEDICATIONS ORDERED IN ED: Medications  iohexol (OMNIPAQUE) 350 MG/ML injection 75 mL (75 mLs Intravenous Contrast Given 06/17/23 1355)  acetaminophen (TYLENOL) tablet 650 mg (650 mg Oral Given 06/17/23 1439)     IMPRESSION / MDM / ASSESSMENT AND PLAN / ED COURSE   I reviewed the triage vital signs and the nursing notes.   Differential diagnosis includes, but is not limited to, pulmonary embolism, medication side effect, viral illness, COVID-19, pericarditis.  Patient is awake and alert, he mildly tachycardic on arrival to 107, though normotensive and afebrile.  I reviewed the patient's chart.  She was just seen at [facility] clinic at which time she had blood work and a D-dimer which was elevated and she was sent to the emergency department for further evaluation.  Patient agrees to proceed with CT angiogram.  Repeated labs are reassuring, negative troponin, negative CPK.  She has no swelling or tenderness in her extremities, all appear to be equal in circumference.  No skin changes noted.  COVID swab was negative at [place].  Patient was passed off to L. Robley Fries, PA-C pending CTA and final disposition.   Patient's presentation is most consistent with acute presentation with potential threat to life or bodily function.   FINAL CLINICAL IMPRESSION(S) / ED DIAGNOSES   Final diagnoses:  Chest pain, unspecified type     Rx / DC Orders   ED Discharge Orders     None        Note:  This document was prepared using Dragon voice recognition software and may include unintentional dictation errors.   Keturah Shavers 06/17/23 1505    Minna Antis, MD 06/17/23 [phone]

## 2023-06-17 NOTE — ED Triage Notes (Signed)
Pt states that she went to [facility] today due to not feeling well for the past 3 weeks, swelling to her hands and feet and elevated bp, pt was called to go to the ER after results showed an elevated d-dimer

## 2023-06-17 NOTE — Discharge Instructions (Addendum)
Your CT scan was normal today.  Please follow-up with your primary care as needed.  You can always return to the ED with any new or worsening symptoms like chest pain or shortness of breath.

## 2023-06-17 NOTE — ED Provider Notes (Signed)
----------------------------------------- 3:54 PM on 06/17/2023 -----------------------------------------  Blood pressure (!) 152/103, pulse 94, temperature 97.8 F (36.6 C), temperature source Oral, resp. rate 16, height 5\' 4"  (1.626 m), weight 88.9 kg, last menstrual period 02/15/2021, SpO2 95%.  Assuming care from [facility] [place].  In short, Lisa Hurst is a 52 y.o. female with a chief complaint of Abnormal Lab .  Refer to the original H&P for additional details.  The current plan of care is to write results of CT angiogram to rule out pulmonary embolism, if negative patient can be discharged for outpatient management..  ____________________________________________    ED Results / Procedures / Treatments   Labs (all labs ordered are listed, but only abnormal results are displayed) Labs Reviewed  CBC - Abnormal; Notable for the following components:      Result Value   Hemoglobin 11.8 (*)    All other components within normal limits  URINALYSIS, ROUTINE W REFLEX MICROSCOPIC - Abnormal; Notable for the following components:   Color, Urine YELLOW (*)    APPearance CLEAR (*)    All other components within normal limits  D-DIMER, QUANTITATIVE - Abnormal; Notable for the following components:   D-Dimer, Quant 0.75 (*)    All other components within normal limits  BASIC METABOLIC PANEL  BRAIN NATRIURETIC PEPTIDE  CK  TROPONIN I (HIGH SENSITIVITY)  TROPONIN I (HIGH SENSITIVITY)    RADIOLOGY  I personally viewed and evaluated these images as part of my medical decision making, as well as reviewing the written report by the radiologist.  ED Provider Interpretation: CT angiogram negative for pulmonary embolism.  Chest x-ray shows bibasilar atelectasis or scarring.  CT Angio Chest PE W and/or Wo Contrast  Result Date: 06/17/2023 CLINICAL DATA:  Shortness of breath for a week.  Positive D-dimer. EXAM: CT ANGIOGRAPHY CHEST WITH CONTRAST TECHNIQUE: Multidetector CT imaging of  the chest was performed using the standard protocol during bolus administration of intravenous contrast. Multiplanar CT image reconstructions and MIPs were obtained to evaluate the vascular anatomy. RADIATION DOSE REDUCTION: This exam was performed according to the departmental dose-optimization program which includes automated exposure control, adjustment of the mA and/or kV according to patient size and/or use of iterative reconstruction technique. CONTRAST:  75mL OMNIPAQUE IOHEXOL 350 MG/ML SOLN COMPARISON:  July 06, 2022.  November 17, 2019. FINDINGS: Cardiovascular: Satisfactory opacification of the pulmonary arteries to the segmental level. No evidence of pulmonary embolism. Normal heart size. No pericardial effusion. Mediastinum/Nodes: Thyroid gland and esophagus are unremarkable. Stable 3.4 x 3.2 cm rounded low density seen to left of distal esophagus and descending thoracic aorta with average Hounsfield measurement of 19. This most likely represents benign bronchogenic or duplication cyst. Lungs/Pleura: No pneumothorax or pleural effusion is noted. Mild bibasilar subsegmental atelectasis is noted. Upper Abdomen: No acute abnormality. Musculoskeletal: No chest wall abnormality. No acute or significant osseous findings. Review of the MIP images confirms the above findings. IMPRESSION: No definite evidence of pulmonary embolus. Mild bibasilar subsegmental atelectasis. Electronically Signed   By: Lupita Raider M.D.   On: 06/17/2023 15:15   DG Chest 2 View  Result Date: 06/17/2023 CLINICAL DATA:  Chest pain. Hypertension, extremity swelling, and elevated D-dimer. EXAM: CHEST - 2 VIEW COMPARISON:  Chest radiographs and CTA 07/06/2022 FINDINGS: The cardiomediastinal silhouette is unchanged. Curvilinear opacities are again seen in both lung bases and may reflect atelectasis or scarring. The upper lungs are clear. No pulmonary edema, pleural effusion, or pneumothorax is identified. No acute osseous  abnormality is  seen. IMPRESSION: Bibasilar atelectasis or scarring. Electronically Signed   By: Sebastian Ache M.D.   On: 06/17/2023 15:10     PROCEDURES:  Critical Care performed: No  Procedures   MEDICATIONS ORDERED IN ED: Medications  iohexol (OMNIPAQUE) 350 MG/ML injection 75 mL (75 mLs Intravenous Contrast Given 06/17/23 1355)  acetaminophen (TYLENOL) tablet 650 mg (650 mg Oral Given 06/17/23 1439)     IMPRESSION / MDM / ASSESSMENT AND PLAN / ED COURSE  I reviewed the triage vital signs and the nursing notes.                             52 year old female presents because of an elevated D-dimer.  Patient is hypertensive otherwise VSS.  Patient NAD on exam.  Differential diagnosis includes, but is not limited to, pulmonary embolism, medication side effect, viral illness, COVID-19.  Patient's presentation is most consistent with acute presentation with potential threat to life or bodily function.  CT angiogram did not show pulmonary embolism.  Patient's diagnosis is consistent with chest pain. Patient is to follow up with her PCP provider as needed or otherwise directed. Patient is given ED precautions to return to the ED for any worsening or new symptoms.     FINAL CLINICAL IMPRESSION(S) / ED DIAGNOSES   Final diagnoses:  Chest pain, unspecified type     Rx / DC Orders   ED Discharge Orders     None        Note:  This document was prepared using Dragon voice recognition software and may include unintentional dictation errors.    Cameron Ali, PA-C 06/17/23 1601    Willy Eddy, MD 06/17/23 1739

## 2023-07-27 ENCOUNTER — Other Ambulatory Visit: Payer: Self-pay | Admitting: Dermatology

## 2023-09-21 DIAGNOSIS — N951 Menopausal and female climacteric states: Secondary | ICD-10-CM | POA: Insufficient documentation

## 2023-11-01 ENCOUNTER — Other Ambulatory Visit: Payer: Self-pay

## 2023-11-01 ENCOUNTER — Emergency Department
Admission: EM | Admit: 2023-11-01 | Discharge: 2023-11-01 | Disposition: A | Discharge status: Home / Self Care | Payer: Medicaid Other | Attending: Emergency Medicine | Admitting: Emergency Medicine

## 2023-11-01 DIAGNOSIS — I10 Essential (primary) hypertension: Secondary | ICD-10-CM

## 2023-11-01 DIAGNOSIS — M5481 Occipital neuralgia: Secondary | ICD-10-CM | POA: Diagnosis not present

## 2023-11-01 LAB — BASIC METABOLIC PANEL
Anion gap: 9 (ref 5–15)
BUN: 18 mg/dL (ref 6–20)
CO2: 25 mmol/L (ref 22–32)
Calcium: 9.4 mg/dL (ref 8.9–10.3)
Chloride: 104 mmol/L (ref 98–111)
Creatinine, Ser: 0.86 mg/dL (ref 0.44–1.00)
GFR, Estimated: >60 mL/min (ref >60)
Glucose, Bld: 95 mg/dL (ref 70–99)
Potassium: 4.3 mmol/L (ref 3.5–5.1)
Sodium: 138 mmol/L (ref 135–145)

## 2023-11-01 LAB — CBC WITH DIFFERENTIAL/PLATELET
Abs Immature Granulocytes: 0.03 10*3/uL (ref 0.00–0.07)
Basophils Absolute: 0 10*3/uL (ref 0.0–0.1)
Basophils Relative: 1 %
Eosinophils Absolute: 0.1 10*3/uL (ref 0.0–0.5)
Eosinophils Relative: 2 %
HCT: 34.4 % — ABNORMAL LOW (ref 36.0–46.0)
Hemoglobin: 10.8 g/dL — ABNORMAL LOW (ref 12.0–15.0)
Immature Granulocytes: 1 %
Lymphocytes Relative: 30 %
Lymphs Abs: 1.5 10*3/uL (ref 0.7–4.0)
MCH: 26.5 pg (ref 26.0–34.0)
MCHC: 31.4 g/dL (ref 30.0–36.0)
MCV: 84.3 fL (ref 80.0–100.0)
Monocytes Absolute: 0.5 10*3/uL (ref 0.1–1.0)
Monocytes Relative: 10 %
Neutro Abs: 3 10*3/uL (ref 1.7–7.7)
Neutrophils Relative %: 56 %
Platelets: 340 10*3/uL (ref 150–400)
RBC: 4.08 MIL/uL (ref 3.87–5.11)
RDW: 15.8 % — ABNORMAL HIGH (ref 11.5–15.5)
WBC: 5.2 10*3/uL (ref 4.0–10.5)
nRBC: 0 % (ref 0.0–0.2)

## 2023-11-01 MED ORDER — DIPHENHYDRAMINE HCL 50 MG/ML IJ SOLN
25.0000 mg | Freq: Once | INTRAMUSCULAR | Status: AC
  Administered 2023-11-01: 25 mg via INTRAVENOUS
  Filled 2023-11-01: qty 1

## 2023-11-01 MED ORDER — KETOROLAC TROMETHAMINE 30 MG/ML IJ SOLN
15.0000 mg | Freq: Once | INTRAMUSCULAR | Status: AC
  Administered 2023-11-01: 30 mg via INTRAVENOUS
  Filled 2023-11-01: qty 1

## 2023-11-01 MED ORDER — PROCHLORPERAZINE EDISYLATE 10 MG/2ML IJ SOLN
10.0000 mg | Freq: Once | INTRAMUSCULAR | Status: AC
  Administered 2023-11-01: 10 mg via INTRAVENOUS
  Filled 2023-11-01: qty 2

## 2023-11-01 NOTE — ED Triage Notes (Signed)
Patient states she is has high blood pressure, thumb pain in the right hand and behind the ear pain related to her neuropathy. Patient states when she stands up  after sitting for a long time it feels like pins and needles. Patient took scheduled BP medications. States her medication has been up since receiving a nerve block.

## 2023-11-01 NOTE — ED Provider Notes (Signed)
[facility] Provider Note    Event Date/Time   First MD Initiated Contact with Patient 11/01/23 [phone]     (approximate)   History   Chief Complaint Hypertension   HPI  Lisa Hurst is a 53 y.o. female with past medical history of hypertension and chronic pain syndrome who presents to the ED complaining of hypertension.  Patient reports that her blood pressure has been running high for the past few weeks, typically around 160/100 when she checks it at home.  She states she has been taking her hydralazine, losartan, and metoprolol as prescribed.  Her dose of hydralazine has been increased by her PCP recently without significant effect.  She does report ongoing chronic headache, states she has been diagnosed with occipital neuralgia on the left side.  She had previous surgery for this and has received multiple injections to try to manage the pain without significant relief.  She continues to take Lyrica and amitriptyline without relief of her symptoms.     Physical Exam   Triage Vital Signs: ED Triage Vitals  Encounter Vitals Group     BP 11/01/23 0853 (!) 160/106     Systolic BP Percentile --      Diastolic BP Percentile --      Pulse Rate 11/01/23 0853 93     Resp 11/01/23 0853 15     Temp 11/01/23 0853 98.4 F (36.9 C)     Temp Source 11/01/23 0853 Oral     SpO2 11/01/23 0853 96 %     Weight 11/01/23 0855 207 lb (93.9 kg)     Height 11/01/23 0855 5\' 4"  (1.626 m)     Head Circumference --      Peak Flow --      Pain Score 11/01/23 0854 9     Pain Loc --      Pain Education --      Exclude from Growth Chart --     Most recent vital signs: Vitals:   11/01/23 0853 11/01/23 1130  BP: (!) 160/106 (!) 148/101  Pulse: 93 77  Resp: 15 15  Temp: 98.4 F (36.9 C) 97.9 F (36.6 C)  SpO2: 96% 93%    Constitutional: Alert and oriented. Eyes: Conjunctivae are normal. Head: Atraumatic.  Tenderness to palpation over left side of the occiput as well  as behind left ear with no erythema, edema, warmth.  Tenderness extends over left trapezius to the left shoulder. Nose: No congestion/rhinnorhea. Mouth/Throat: Mucous membranes are moist.  Cardiovascular: Normal rate, regular rhythm. Grossly normal heart sounds.  2+ radial pulses bilaterally. Respiratory: Normal respiratory effort.  No retractions. Lungs CTAB. Gastrointestinal: Soft and nontender. No distention. Musculoskeletal: No lower extremity tenderness nor edema.  Neurologic:  Normal speech and language. No gross focal neurologic deficits are appreciated.    ED Results / Procedures / Treatments   Labs (all labs ordered are listed, but only abnormal results are displayed) Labs Reviewed  CBC WITH DIFFERENTIAL/PLATELET - Abnormal; Notable for the following components:      Result Value   Hemoglobin 10.8 (*)    HCT 34.4 (*)    RDW 15.8 (*)    All other components within normal limits  BASIC METABOLIC PANEL     EKG  ED ECG REPORT I, Chesley Noon, the attending physician, personally viewed and interpreted this ECG.   Date: 11/01/2023  EKG Time: 10:13  Rate: 83  Rhythm: normal sinus rhythm  Axis: Normal  Intervals:none  ST&T Change:  None  PROCEDURES:  Critical Care performed: No  Procedures   MEDICATIONS ORDERED IN ED: Medications  prochlorperazine (COMPAZINE) injection 10 mg (10 mg Intravenous Given 11/01/23 1026)  diphenhydrAMINE (BENADRYL) injection 25 mg (25 mg Intravenous Given 11/01/23 1027)  ketorolac (TORADOL) 30 MG/ML injection 15 mg (30 mg Intravenous Given 11/01/23 1027)     IMPRESSION / MDM / ASSESSMENT AND PLAN / ED COURSE  I reviewed the triage vital signs and the nursing notes.                              53 y.o. female with past medical history of hypertension and chronic pain syndrome who presents to the ED complaining of elevated blood pressure for the past 2 weeks in the setting of ongoing pain to the left back of her head extending towards  her left shoulder.  Patient's presentation is most consistent with acute presentation with potential threat to life or bodily function.  Differential diagnosis includes, but is not limited to, hypertensive emergency, ACS, AKI, stroke, chronic pain syndrome, occipital neuralgia.  Patient nontoxic-appearing and in no acute distress, vital signs remarkable for hypertension but otherwise reassuring.  Occipital headache appears to be a chronic issue for the patient with no concerning features on exam, do not feel imaging warranted at this time.  We will screen EKG and labs for evidence of hypertensive emergency, treat symptomatically with IV Compazine, Benadryl, and Toradol.  Patient with minimal improvement in occipital headache following migraine cocktail, but this seems to be a chronic issue for her and she declines additional intervention.  Blood pressure slightly improved on recheck and labs are reassuring with no significant anemia, leukocytosis, lactate abnormality, or AKI.  EKG shows no evidence of arrhythmia or ischemia, no findings concerning for hypertensive emergency at this time.  Patient appropriate for discharge home with PCP and neurology follow-up.  She was counseled to return to the ED for new or worsening symptoms, patient agrees with plan.      FINAL CLINICAL IMPRESSION(S) / ED DIAGNOSES   Final diagnoses:  Occipital neuralgia of left side  Uncontrolled hypertension     Rx / DC Orders   ED Discharge Orders     None        Note:  This document was prepared using Dragon voice recognition software and may include unintentional dictation errors.   Chesley Noon, MD 11/01/23 [phone]

## 2023-11-08 ENCOUNTER — Ambulatory Visit: Payer: Medicaid Other | Admitting: Dermatology

## 2023-12-06 ENCOUNTER — Emergency Department
Admission: EM | Admit: 2023-12-06 | Discharge: 2023-12-06 | Disposition: A | Discharge status: Home / Self Care | Attending: Emergency Medicine | Admitting: Emergency Medicine

## 2023-12-06 ENCOUNTER — Other Ambulatory Visit: Payer: Self-pay

## 2023-12-06 DIAGNOSIS — G500 Trigeminal neuralgia: Secondary | ICD-10-CM | POA: Insufficient documentation

## 2023-12-06 DIAGNOSIS — G4489 Other headache syndrome: Secondary | ICD-10-CM | POA: Diagnosis not present

## 2023-12-06 DIAGNOSIS — M5481 Occipital neuralgia: Secondary | ICD-10-CM | POA: Insufficient documentation

## 2023-12-06 DIAGNOSIS — R519 Headache, unspecified: Secondary | ICD-10-CM | POA: Diagnosis present

## 2023-12-06 DIAGNOSIS — I10 Essential (primary) hypertension: Secondary | ICD-10-CM | POA: Diagnosis not present

## 2023-12-06 MED ORDER — CYCLOBENZAPRINE HCL 10 MG PO TABS
10.0000 mg | ORAL_TABLET | Freq: Once | ORAL | Status: AC
  Administered 2023-12-06: 10 mg via ORAL
  Filled 2023-12-06: qty 1

## 2023-12-06 MED ORDER — PREDNISONE 20 MG PO TABS: 40.0000 mg | ORAL_TABLET | Freq: Every day | ORAL | 0 refills | Status: AC

## 2023-12-06 MED ORDER — METOCLOPRAMIDE HCL 5 MG/ML IJ SOLN
10.0000 mg | Freq: Once | INTRAMUSCULAR | Status: AC
  Administered 2023-12-06: 10 mg via INTRAVENOUS
  Filled 2023-12-06: qty 2

## 2023-12-06 MED ORDER — DIPHENHYDRAMINE HCL 50 MG/ML IJ SOLN
25.0000 mg | Freq: Once | INTRAMUSCULAR | Status: AC
  Administered 2023-12-06: 25 mg via INTRAVENOUS
  Filled 2023-12-06: qty 1

## 2023-12-06 MED ORDER — DEXAMETHASONE SODIUM PHOSPHATE 10 MG/ML IJ SOLN
10.0000 mg | Freq: Once | INTRAMUSCULAR | Status: AC
  Administered 2023-12-06: 10 mg via INTRAVENOUS
  Filled 2023-12-06: qty 1

## 2023-12-06 MED ORDER — KETOROLAC TROMETHAMINE 30 MG/ML IJ SOLN
30.0000 mg | Freq: Once | INTRAMUSCULAR | Status: AC
  Administered 2023-12-06: 30 mg via INTRAVENOUS
  Filled 2023-12-06: qty 1

## 2023-12-06 MED ORDER — CYCLOBENZAPRINE HCL 5 MG PO TABS: 5.0000 mg | ORAL_TABLET | Freq: Three times a day (TID) | ORAL | 1 refills | Status: AC | PRN

## 2023-12-06 MED ORDER — METOCLOPRAMIDE HCL 10 MG PO TABS: 10.0000 mg | ORAL_TABLET | Freq: Three times a day (TID) | ORAL | 1 refills | Status: DC | PRN

## 2023-12-06 NOTE — ED Provider Notes (Signed)
 [facility] Emergency Department Provider Note     Event Date/Time   First MD Initiated Contact with Patient 12/06/23 1249     (approximate)   History   Otalgia and Headache   HPI  Lisa Hurst is a 53 y.o. female with a history of HTN, anxiety, meningioma status post excisional surgery, and occipital/trigeminal neuralgia, and chronic pain, presents to the ED for evaluation of a persistent headache.  Patient reports the presentation is consistent with her chronic headache syndrome.  She has been previously under the care of [facility] neurosurgery, but is being referred to [facility] neurology with an appointment pending on March 10.  Patient has been taking her prescription home meds, but denies any significant benefit for her headache.  She denies any recent injury, trauma, fall.  No fevers, chills, sweats.  No vision change, paralysis, weakness, or paresthesias above baseline.  She described typical headache which is on the left side of the head at the mastoid region, with significant discomfort at her left ear.  The headache ascends over the top of her head.  She denies any tinnitus, vertigo, vision loss, or weakness.  Physical Exam   Triage Vital Signs: ED Triage Vitals  Encounter Vitals Group     BP 12/06/23 1207 (!) 141/89     Systolic BP Percentile --      Diastolic BP Percentile --      Pulse Rate 12/06/23 1207 (!) 110     Resp 12/06/23 1207 18     Temp 12/06/23 1207 99.1 F (37.3 C)     Temp Source 12/06/23 1207 Oral     SpO2 12/06/23 1207 98 %     Weight 12/06/23 1206 220 lb (99.8 kg)     Height 12/06/23 1206 5\' 4"  (1.626 m)     Head Circumference --      Peak Flow --      Pain Score --      Pain Loc --      Pain Education --      Exclude from Growth Chart --     Most recent vital signs: Vitals:   12/06/23 1207 12/06/23 1907  BP: (!) 141/89 121/80  Pulse: (!) 110 83  Resp: 18 17  Temp: 99.1 F (37.3 C)   SpO2: 98% 95%     General Awake, no distress. NAD A&O x 4 HEENT NCAT. PERRL. EOMI. No rhinorrhea. Mucous membranes are moist.  CV:  Good peripheral perfusion. RRR RESP:  Normal effort. CTA ABD:  No distention.  NEURO: Cranial nerves II to XII grossly intact, except for some baseline paralysis of the left upper lid.     ED Results / Procedures / Treatments   Labs (all labs ordered are listed, but only abnormal results are displayed) Labs Reviewed - No data to display   EKG   RADIOLOGY  No results found.   PROCEDURES:  Critical Care performed: No  Procedures   MEDICATIONS ORDERED IN ED: Medications  diphenhydrAMINE (BENADRYL) injection 25 mg (25 mg Intravenous Given 12/06/23 1519)  metoCLOPramide (REGLAN) injection 10 mg (10 mg Intravenous Given 12/06/23 1520)  ketorolac (TORADOL) 30 MG/ML injection 30 mg (30 mg Intravenous Given 12/06/23 1519)  dexamethasone (DECADRON) injection 10 mg (10 mg Intravenous Given 12/06/23 1732)  cyclobenzaprine (FLEXERIL) tablet 10 mg (10 mg Oral Given 12/06/23 1732)     IMPRESSION / MDM / ASSESSMENT AND PLAN / ED COURSE  I reviewed the triage vital signs and the  nursing notes.                              Differential diagnosis includes, but is not limited to, intracranial hemorrhage, meningitis/encephalitis, previous head trauma, cavernous venous thrombosis, tension headache, temporal arteritis, migraine or migraine equivalent, idiopathic intracranial hypertension, and non-specific headache.  Patient's presentation is most consistent with acute, uncomplicated illness.  Patient's diagnosis is consistent with chronic headache syndrome, and chronic occipital/trigeminal nerve irritation.  Patient consented to IV medication management here in the ED.  Her exam is overall stable and reassuring at this time.  She received IV doses of Decadron, Reglan, diphenhydramine, and ketorolac.  She also received an oral dose of cyclobenzaprine.  Patient would endorse  significant improvement of her headache syndrome at the time of this interval evaluation.  She is rating her pain at a 7 out of 10 currently, which by her report is the most pain relief she has got in several weeks.  She is stable at this time without any ongoing complaint, requesting discharge home.  Patient will be discharged home with prescriptions for cyclobenzaprine, Reglan, and prednisone. Patient is to follow up with her primary provider and neurologist as scheduled, as needed or otherwise directed. Patient is given ED precautions to return to the ED for any worsening or new symptoms.  FINAL CLINICAL IMPRESSION(S) / ED DIAGNOSES   Final diagnoses:  Other headache syndrome  Trigeminal neuralgia  Occipital neuralgia of left side     Rx / DC Orders   ED Discharge Orders          Ordered    cyclobenzaprine (FLEXERIL) 5 MG tablet  3 times daily PRN        12/06/23 1854    metoCLOPramide (REGLAN) 10 MG tablet  Every 8 hours PRN        12/06/23 1854    predniSONE (DELTASONE) 20 MG tablet  Daily with breakfast        12/06/23 1854             Note:  This document was prepared using Dragon voice recognition software and may include unintentional dictation errors.    Lissa Hoard, PA-C 12/06/23 1932    Merwyn Katos, MD 12/07/23 [phone]

## 2023-12-06 NOTE — Discharge Instructions (Addendum)
 Your exam and workup is reassuring at this time.  Your symptoms are likely due to your chronic headache syndrome complicated by your occipital/trigeminal neuralgia.  Take the prescription meds as directed.  Additional OTC Benadryl may be helpful for pain relief.  Continue to rest and hydrate as necessary.  Monitor and track your headache syndrome including time of onset, severity, and aggravating or alleviating factors.  Follow-up with neurology as scheduled.  Return to the ED if needed.

## 2023-12-06 NOTE — ED Notes (Signed)
 IV team at bedside

## 2023-12-06 NOTE — ED Triage Notes (Signed)
 Pt sts that she is here for severe pain behind her left ear. Pt is also here for multiple other complaints such as dizziness, migraine headache on top of her head, hand shaking and other countless chronic issues. Pt was sent over from [facility] for a neuro eval and pain management. Pt does taken methadone daily.

## 2024-01-10 ENCOUNTER — Encounter: Payer: Self-pay | Admitting: Dermatology

## 2024-01-10 ENCOUNTER — Ambulatory Visit (INDEPENDENT_AMBULATORY_CARE_PROVIDER_SITE_OTHER): Payer: Medicaid Other | Admitting: Dermatology

## 2024-01-10 DIAGNOSIS — L700 Acne vulgaris: Secondary | ICD-10-CM

## 2024-01-10 MED ORDER — TRETINOIN 0.05 % EX GEL
CUTANEOUS | 5 refills | Status: DC
Start: 2024-01-10 — End: 2024-04-05

## 2024-01-10 NOTE — Progress Notes (Signed)
   Follow-Up Visit   Subjective  Lisa Hurst is a 53 y.o. female who presents for the following: acne follow up. Patient currently using azelaic acid gel once daily and tretinoin 0.05% cr nightly. Patient tolerating well and feels that acne has improved. 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  She is followed by neurology.  The patient has spots, moles and lesions to be evaluated, some may be new or changing and the patient may have concern these could be cancer.   The following portions of the chart were reviewed this encounter and updated as appropriate: medications, allergies, medical history  Review of Systems:  No other skin or systemic complaints except as noted in HPI or Assessment and Plan.  Objective  Well appearing patient in no apparent distress; mood and affect are within normal limits.   A focused examination was performed of the following areas: face  Relevant exam findings are noted in the Assessment and Plan.    Assessment & Plan   ACNE VULGARIS Exam: hyperpigmented macules at face, few small closed comedones at chin, perioral  Chronic condition with duration or expected duration over one year. Currently well-controlled. Pt is satisfied with treatment.  Treatment Plan: Continue azelaic acid gel qam Continue tretinoin 0.05% gel at bedtime as tolerated  Topical retinoid medications like tretinoin/Retin-A, adapalene/Differin, tazarotene/Fabior, and Epiduo/Epiduo Forte can cause dryness and irritation when first started. Only apply a pea-sized amount to the entire affected area. Avoid applying it around the eyes, edges of mouth and creases at the nose. If you experience irritation, use a good moisturizer first and/or apply the medicine less often. If you are doing well with the medicine, you can increase how often you use it until you are applying every night. Be careful with sun protection while using this medication as it  can make you sensitive to the sun. This medicine should not be used by pregnant women.      ACNE VULGARIS   Related Medications tretinoin (ALTRALIN) 0.05 % gel Apply topically to face qam for acne. If flared can use twice daily. tretinoin (ALTRALIN) 0.05 % gel Apply pea sized amount nightly to face for acne  Return in about 1 year (around 01/09/2025) for with Dr. Roseanne Reno, acne.  Lisa Hurst, RMA, am acting as scribe for Willeen Niece, MD .   Documentation: I have reviewed the above documentation for accuracy and completeness, and I agree with the above.  Willeen Niece, MD

## 2024-01-10 NOTE — Patient Instructions (Signed)

## 2024-03-03 ENCOUNTER — Other Ambulatory Visit: Payer: Self-pay | Admitting: Neurosurgery

## 2024-03-03 DIAGNOSIS — G950 Syringomyelia and syringobulbia: Secondary | ICD-10-CM

## 2024-03-03 DIAGNOSIS — D329 Benign neoplasm of meninges, unspecified: Secondary | ICD-10-CM

## 2024-03-10 ENCOUNTER — Ambulatory Visit

## 2024-03-10 ENCOUNTER — Ambulatory Visit: Admission: RE | Admit: 2024-03-10 | Source: Ambulatory Visit

## 2024-03-20 ENCOUNTER — Ambulatory Visit

## 2024-03-20 ENCOUNTER — Other Ambulatory Visit

## 2024-03-24 ENCOUNTER — Ambulatory Visit: Payer: Self-pay

## 2024-03-24 NOTE — Telephone Encounter (Signed)
 FYI Only or Action Required?: FYI only for provider.  Patient was last seen in primary care on yesterday. Called Nurse Triage reporting Otalgia. Symptoms began several years ago. Interventions attempted: Prescription medications: injections in shoulder and neck, flexeril , robaxin , lyrica. Symptoms are: left ear pain (back of ear and behind ear), bilateral hand pain, swelling in feet/hands/legs gradually worsening.  Triage Disposition: See PCP Within 2 Weeks (overriding See HCP Within 4 Hours (Or PCP Triage))  Patient/caregiver understands and will follow disposition?: Yes                  Copied from CRM [id number]. Topic: Clinical - Red Word Triage >> Mar 24, 2024  8:06 AM Lisa Hurst wrote: Red Word that prompted transfer to Nurse Triage: The patient was calling to establish care at Primary Care at [facility] as she is a previous patient of Dr. Ziglar. The patient states that her hands, feet, and legs are swollen but she did see a Specialist for this yesterday. However, she has worsening burning behind her ear from neuralgia. Reason for Disposition  [1] SEVERE pain AND [2] not improved 2 hours after taking analgesic medication (e.g., ibuprofen  or acetaminophen )  Answer Assessment - Initial Assessment Questions Patient states she has seen her neurosurgeon (Lisa Hurst) regarding the pain in her ear and was told she could have an MRI but her insurance has been holding it up.    1. LOCATION: Which ear is involved?     Left ear. (Back side of ear and behind the ear)  2. ONSET: When did the ear start hurting      January 2023. She states it has been chronic pain from the surgery she had then and the pain has been worsening and becoming constant.  3. SEVERITY: How bad is the pain?  (Scale 1-10; mild, moderate or severe)   - MILD (1-3): doesn't interfere with normal activities    - MODERATE (4-7): interferes with normal activities or awakens from sleep    - SEVERE (8-10):  excruciating pain, unable to do any normal activities      9/10, she states she has not taken anything yet for pain.  4. URI SYMPTOMS: Do you have a runny nose or cough?     No.  5. FEVER: Do you have a fever? If Yes, ask: What is your temperature, how was it measured, and when did it start?     No.  6. CAUSE: Have you been swimming recently?, How often do you use Q-TIPS?, Have you had any recent air travel or scuba diving?     No.  7. OTHER SYMPTOMS: Do you have any other symptoms? (e.g., headache, stiff neck, dizziness, vomiting, runny nose, decreased hearing)     Swelling in hands, feet, legs (saw a NP at her old PCP yesterday for treatment), burning in left hand, pain in right hand.  8. PREGNANCY: Is there any chance you are pregnant? When was your last menstrual period?     N/A.  Protocols used: Louie Rover

## 2024-03-29 ENCOUNTER — Ambulatory Visit: Admitting: Family Medicine

## 2024-04-05 ENCOUNTER — Ambulatory Visit (INDEPENDENT_AMBULATORY_CARE_PROVIDER_SITE_OTHER): Admitting: Family Medicine

## 2024-04-05 ENCOUNTER — Ambulatory Visit: Admitting: Family Medicine

## 2024-04-05 VITALS — BP 167/113 | HR 95 | Temp 98.3°F | Resp 20 | Ht 64.0 in | Wt 236.0 lb

## 2024-04-05 DIAGNOSIS — M25561 Pain in right knee: Secondary | ICD-10-CM

## 2024-04-05 DIAGNOSIS — F419 Anxiety disorder, unspecified: Secondary | ICD-10-CM

## 2024-04-05 DIAGNOSIS — M25562 Pain in left knee: Secondary | ICD-10-CM

## 2024-04-05 DIAGNOSIS — G894 Chronic pain syndrome: Secondary | ICD-10-CM | POA: Diagnosis not present

## 2024-04-05 DIAGNOSIS — R002 Palpitations: Secondary | ICD-10-CM

## 2024-04-05 DIAGNOSIS — I10 Essential (primary) hypertension: Secondary | ICD-10-CM | POA: Diagnosis not present

## 2024-04-05 MED ORDER — CHLORTHALIDONE 25 MG PO TABS: 25.0000 mg | ORAL_TABLET | Freq: Every day | ORAL | 1 refills | Status: DC

## 2024-04-05 MED ORDER — ESCITALOPRAM OXALATE 20 MG PO TABS: 20.0000 mg | ORAL_TABLET | Freq: Every day | ORAL | 1 refills | Status: DC

## 2024-04-05 NOTE — Progress Notes (Signed)
 New Patient Office Visit  Subjective    Patient ID: Lisa Hurst, female    DOB: 07-10-1971  Age: 53 y.o. MRN: [id number]  CC:  Chief Complaint  Patient presents with   Establish Care   Edema   facial paralysis   Neurologic Problem         HPI Lisa Hurst presents to establish care.  She is a 53 year old with hypertension, anxiety, GERD, chronic pain syndrome (s/p  resection of syrinx of the spinal cord and meningioma, s/p craniotomy for resection of a posterior fossa mass).  She has a chronic pain syndrome of her left face left neck left shoulder and left arm.  She is also getting migraines really badly.  They have not been able to come up with a regimen that will control her pain.  She has a pain management team in [place],  sees neurosurgery and neurology.  And she has a referral to rheumatology.  She is scheduled for an MRI of the brain, cervical and thoracic spine.  She has pain that is unrelenting of her face, left neck, left shoulder and left arm.  She is currently on Lyrica 100 mg twice daily and methadone.  She takes methadone a half in the morning, half at lunch and then a whole one in the evening.      She also complains that she has been gaining a lot of weight and feels like her medicine may be causing her to be hungry.  She complains of having hot flashes all day and all night and she is not sleeping very well.  She finds that she is angry sometimes because she just does not feel well.      She has problems with hypertension.  She checks her blood pressure at home and gets 160s to 170s over 100's.  She is on losartan 100 mg daily, metoprolol tartrate 25 mg twice daily and hydralazine 10 mg 3 times daily.  She has no side effects or problems with her blood pressure medication.  She is taking her BP medication.        She is on Lexapro  10 mg daily for anxiety.  Her PHQ-9 score is 0 and her GAD-7 score is 21.  She says she feels a little depressed and a little bit sad.  She  finds that she gets anxious because she is in pain.  She feels like there is no hope to get out of this pain.  Her anxiousness is pretty bad.  She reports she gets overwhelmed by small processes like cooking dinner.  She is no longer taking BuSpar. Her last colonoscopy was in 01/2021 at [facility].  She has not had a Pap in a number of years.  Last mammogram was 11/02/2022.   Outpatient Encounter Medications as of 04/05/2024  Medication Sig   amitriptyline (ELAVIL) 100 MG tablet Take 100 mg by mouth at bedtime.   Azelaic Acid  15 % gel APPLY TOPICALLY TO FACE 1-2 TIMES (EVERY MORNING) FOR ACNE   chlorthalidone  (HYGROTON ) 25 MG tablet Take 1 tablet (25 mg total) by mouth daily.   cyclobenzaprine  (FLEXERIL ) 5 MG tablet Take 1-2 tablets (5-10 mg total) by mouth 3 (three) times daily as needed.   escitalopram  (LEXAPRO ) 20 MG tablet Take 1 tablet (20 mg total) by mouth daily.   hydrALAZINE (APRESOLINE) 10 MG tablet Take 30 mg by mouth 3 (three) times daily.   losartan (COZAAR) 100 MG tablet Take 100 mg by mouth daily.  methadone (DOLOPHINE) 5 MG tablet Take 5 mg by mouth at bedtime.   metoCLOPramide  (REGLAN ) 10 MG tablet Take 1 tablet (10 mg total) by mouth every 8 (eight) hours as needed for nausea.   metoprolol tartrate (LOPRESSOR) 25 MG tablet Take 25 mg by mouth 2 (two) times daily.   pregabalin (LYRICA) 100 MG capsule Take 100 mg by mouth 2 (two) times daily.   tretinoin  (ALTRALIN) 0.05 % gel Apply topically to face qam for acne. If flared can use twice daily.   White Petrolatum-Mineral Oil (SYSTANE NIGHTTIME) OINT Apply 1 application to eye 4 (four) times daily as needed.   [DISCONTINUED] escitalopram  (LEXAPRO ) 10 MG tablet Take 10 mg by mouth daily.   [DISCONTINUED] PREDNISONE , PAK, PO Take 1 tablet by mouth daily.   [DISCONTINUED] amLODipine (NORVASC) 5 MG tablet Take 5 mg by mouth daily. (Patient not taking: Reported on 04/05/2024)   [DISCONTINUED] busPIRone (BUSPAR) 15 MG tablet  Take 15 mg by mouth 2 (two) times daily as needed. (Patient not taking: Reported on 04/05/2024)   [DISCONTINUED] Ciclopirox  1 % shampoo lather on scalp, leave on 8-10 minutes, rinse well. (Patient not taking: Reported on 04/05/2024)   [DISCONTINUED] citalopram (CELEXA) 40 MG tablet Take 10 mg by mouth daily. (Patient not taking: Reported on 04/05/2024)   [DISCONTINUED] clindamycin-tretinoin  (ZIANA) gel Apply topically at bedtime. (Patient not taking: Reported on 04/05/2024)   [DISCONTINUED] Fluocinolone  Acetonide Scalp (DERMA-SMOOTHE /FS SCALP) 0.01 % OIL Apply once or twice daily to affected areas on scalp as needed. (Patient not taking: Reported on 04/05/2024)   [DISCONTINUED] ketoconazole  (NIZORAL ) 2 % shampoo Massage into affected area scalp daily, let sit 10 minutes before rinsing. (Patient not taking: Reported on 04/05/2024)   [DISCONTINUED] methocarbamol  (ROBAXIN ) 500 MG tablet Take 500 mg by mouth 4 (four) times daily. (Patient not taking: Reported on 04/05/2024)   [DISCONTINUED] montelukast (SINGULAIR) 10 MG tablet Take 10 mg by mouth daily. (Patient not taking: Reported on 04/05/2024)   [DISCONTINUED] tretinoin  (ALTRALIN) 0.05 % gel Apply pea sized amount nightly to face for acne   No facility-administered encounter medications on file as of 04/05/2024.    Past Medical History:  Diagnosis Date   Allergy 7/17   Anemia    Anxiety    Depression    Heart murmur 07/17   Hypertension    Neuromuscular disorder (HCC) 01/23    Past Surgical History:  Procedure Laterality Date   BRAIN SURGERY     CESAREAN SECTION  03/17/10   COLONOSCOPY WITH PROPOFOL  N/A 01/06/2021   Procedure: COLONOSCOPY WITH PROPOFOL ;  Surgeon: Therisa Bi, MD;  Location: [facility];  Service: Gastroenterology;  Laterality: N/A;   DILATION AND CURETTAGE OF UTERUS     GASTROSTOMY TUBE PLACEMENT  10/13/2021   HERNIA REPAIR      Family History  Problem Relation Age of Onset   Anxiety disorder Mother    COPD Mother    Varicose  Veins Mother    Anxiety disorder Sister    Depression Sister    Cancer Maternal Aunt    Obesity Sister    Anxiety disorder Sister    Depression Sister    Cancer Maternal Aunt    Obesity Sister    Anxiety disorder Sister    Depression Sister    Cancer Maternal Aunt    Obesity Sister     Social History   Socioeconomic History   Marital status: Single    Spouse name: Not on file   Number of children: Not on file  Years of education: Not on file   Highest education level: Bachelor's degree (e.g., BA, AB, BS)  Occupational History   Not on file  Tobacco Use   Smoking status: Former    Current packs/day: 1.50    Average packs/day: 1.5 packs/day for 3.0 years (4.5 ttl pk-yrs)    Types: Cigarettes    Passive exposure: Past   Smokeless tobacco: Never  Vaping Use   Vaping status: Never Used  Substance and Sexual Activity   Alcohol use: Not Currently   Drug use: Not Currently    Types: Marijuana   Sexual activity: Not Currently  Other Topics Concern   Not on file  Social History Narrative   Not on file   Social Drivers of Health   Financial Resource Strain: Medium Risk (04/05/2024)   Overall Financial Resource Strain (CARDIA)    Difficulty of Paying Living Expenses: Somewhat hard  Food Insecurity: Food Insecurity Present (04/05/2024)   Hunger Vital Sign    Worried About Running Out of Food in the Last Year: Often true    Ran Out of Food in the Last Year: Patient declined  Transportation Needs: Unmet Transportation Needs (04/05/2024)   PRAPARE - Transportation    Lack of Transportation (Medical): No    Lack of Transportation (Non-Medical): Yes  Physical Activity: Inactive (04/05/2024)   Exercise Vital Sign    Days of Exercise per Week: 0 days    Minutes of Exercise per Session: Not on file  Stress: Stress Concern Present (04/05/2024)   [employer] of Occupational Health - Occupational Stress Questionnaire    Feeling of Stress: Very much  Social Connections: Unknown  (04/05/2024)   Social Connection and Isolation Panel    Frequency of Communication with Friends and Family: More than three times a week    Frequency of Social Gatherings with Friends and Family: Never    Attends Religious Services: Never    Database administrator or Organizations: No    Attends Engineer, structural: Not on file    Marital Status: Patient declined  Catering manager Violence: Not on file    ROS      Objective   BP (!) 167/113 (BP Location: Right Arm, Patient Position: Sitting, Cuff Size: Large)   Pulse 95   Temp 98.3 F (36.8 C) (Oral)   Resp 20   Ht 5' 4 (1.626 m)   Wt 236 lb (107 kg)   LMP 02/15/2021 (Exact Date)   SpO2 97%   BMI 40.51 kg/m    Physical Exam Vitals and nursing note reviewed.  Constitutional:      Appearance: Normal appearance.  HENT:     Head: Normocephalic and atraumatic.  Eyes:     Conjunctiva/sclera: Conjunctivae normal.  Cardiovascular:     Rate and Rhythm: Normal rate and regular rhythm.  Pulmonary:     Effort: Pulmonary effort is normal.     Breath sounds: Normal breath sounds.  Musculoskeletal:     Right lower leg: No edema.     Left lower leg: No edema.  Skin:    General: Skin is warm and dry.  Neurological:     Mental Status: She is alert and oriented to person, place, and time.     Cranial Nerves: Cranial nerve deficit (Paralysis of the left face) present.  Psychiatric:        Mood and Affect: Mood normal.        Behavior: Behavior normal.        Thought  Content: Thought content normal.        Judgment: Judgment normal.            The ASCVD Risk score (Arnett DK, et al., 2019) failed to calculate for the following reasons:   Cannot find a previous HDL lab   Cannot find a previous total cholesterol lab     Assessment & Plan:  Primary hypertension -     Chlorthalidone ; Take 1 tablet (25 mg total) by mouth daily.  Dispense: 90 tablet; Refill: 1  Anxiety Assessment & Plan: GAD-7 score is 21.   She is on Lexapro  10 mg daily.  She is no longer taking BuSpar.  Increase to Lexapro  20 mg daily.  Please follow-up in a month to reassess.  Orders: -     Escitalopram  Oxalate; Take 1 tablet (20 mg total) by mouth daily.  Dispense: 90 tablet; Refill: 1  Essential hypertension Assessment & Plan: She is on metoprolol tartrate 25 mg daily, hydralazine 10 mg 3 times daily, losartan 100 mg daily and her blood pressure is not well-controlled.  Will start chlorthalidone  25 mg once a day.  Please continue to check your blood pressure at home.  Follow-up in a month and bring your readings with you please   Heart palpitations Assessment & Plan: On metoprolol tartrate 25 mg twice daily.   Chronic pain syndrome Assessment & Plan: Working with neurology and neurosurgery.  Now on Lyrica and methadone.  Pain still is not manageable.   Pain in both knees, unspecified chronicity Assessment & Plan: Reports she has a follow-up appointment with rheumatology because her hands and knees hurt all the time.     No follow-ups on file.   Anasophia Pecor K Divya Munshi, MD

## 2024-04-09 DIAGNOSIS — G894 Chronic pain syndrome: Secondary | ICD-10-CM | POA: Insufficient documentation

## 2024-04-09 DIAGNOSIS — F419 Anxiety disorder, unspecified: Secondary | ICD-10-CM | POA: Insufficient documentation

## 2024-04-09 DIAGNOSIS — M25569 Pain in unspecified knee: Secondary | ICD-10-CM | POA: Insufficient documentation

## 2024-04-09 NOTE — Assessment & Plan Note (Signed)
 She is on metoprolol tartrate 25 mg daily, hydralazine 10 mg 3 times daily, losartan 100 mg daily and her blood pressure is not well-controlled.  Will start chlorthalidone  25 mg once a day.  Please continue to check your blood pressure at home.  Follow-up in a month and bring your readings with you please

## 2024-04-09 NOTE — Assessment & Plan Note (Signed)
 Working with neurology and neurosurgery.  Now on Lyrica and methadone.  Pain still is not manageable.

## 2024-04-09 NOTE — Assessment & Plan Note (Addendum)
 GAD-7 score is 21.  She is on Lexapro  10 mg daily.  She is no longer taking BuSpar.  Increase to Lexapro  20 mg daily.  Please follow-up in a month to reassess.

## 2024-04-09 NOTE — Assessment & Plan Note (Signed)
 Reports she has a follow-up appointment with rheumatology because her hands and knees hurt all the time.

## 2024-04-09 NOTE — Assessment & Plan Note (Signed)
 On metoprolol tartrate 25 mg twice daily.

## 2024-04-12 ENCOUNTER — Emergency Department
Admission: EM | Admit: 2024-04-12 | Discharge: 2024-04-12 | Disposition: A | Discharge status: Home / Self Care | Attending: Emergency Medicine | Admitting: Emergency Medicine

## 2024-04-12 ENCOUNTER — Emergency Department

## 2024-04-12 ENCOUNTER — Other Ambulatory Visit: Payer: Self-pay

## 2024-04-12 DIAGNOSIS — N179 Acute kidney failure, unspecified: Secondary | ICD-10-CM | POA: Diagnosis not present

## 2024-04-12 DIAGNOSIS — G9389 Other specified disorders of brain: Secondary | ICD-10-CM

## 2024-04-12 DIAGNOSIS — D329 Benign neoplasm of meninges, unspecified: Secondary | ICD-10-CM

## 2024-04-12 DIAGNOSIS — R55 Syncope and collapse: Secondary | ICD-10-CM | POA: Insufficient documentation

## 2024-04-12 DIAGNOSIS — E860 Dehydration: Secondary | ICD-10-CM | POA: Diagnosis not present

## 2024-04-12 DIAGNOSIS — R42 Dizziness and giddiness: Secondary | ICD-10-CM | POA: Insufficient documentation

## 2024-04-12 LAB — CBG MONITORING, ED: Glucose-Capillary: 114 mg/dL — ABNORMAL HIGH (ref 70–99)

## 2024-04-12 LAB — DIFFERENTIAL
Abs Immature Granulocytes: 0.08 K/uL — ABNORMAL HIGH (ref 0.00–0.07)
Basophils Absolute: 0.1 K/uL (ref 0.0–0.1)
Basophils Relative: 1 %
Eosinophils Absolute: 0.1 K/uL (ref 0.0–0.5)
Eosinophils Relative: 1 %
Immature Granulocytes: 1 %
Lymphocytes Relative: 32 %
Lymphs Abs: 3.4 K/uL (ref 0.7–4.0)
Monocytes Absolute: 0.8 K/uL (ref 0.1–1.0)
Monocytes Relative: 7 %
Neutro Abs: 6.3 K/uL (ref 1.7–7.7)
Neutrophils Relative %: 58 %

## 2024-04-12 LAB — COMPREHENSIVE METABOLIC PANEL WITH GFR
ALT: 35 U/L (ref 0–44)
AST: 26 U/L (ref 15–41)
Albumin: 4.4 g/dL (ref 3.5–5.0)
Alkaline Phosphatase: 95 U/L (ref 38–126)
Anion gap: 16 — ABNORMAL HIGH (ref 5–15)
BUN: 26 mg/dL — ABNORMAL HIGH (ref 6–20)
CO2: 28 mmol/L (ref 22–32)
Calcium: 9.4 mg/dL (ref 8.9–10.3)
Chloride: 96 mmol/L — ABNORMAL LOW (ref 98–111)
Creatinine, Ser: 1.54 mg/dL — ABNORMAL HIGH (ref 0.44–1.00)
GFR, Estimated: 41 mL/min — ABNORMAL LOW (ref >60)
Glucose, Bld: 121 mg/dL — ABNORMAL HIGH (ref 70–99)
Potassium: 3.9 mmol/L (ref 3.5–5.1)
Sodium: 140 mmol/L (ref 135–145)
Total Bilirubin: 0.7 mg/dL (ref 0.0–1.2)
Total Protein: 8.3 g/dL — ABNORMAL HIGH (ref 6.5–8.1)

## 2024-04-12 LAB — PROTIME-INR
INR: 1 (ref 0.8–1.2)
Prothrombin Time: 13.9 s (ref 11.4–15.2)

## 2024-04-12 LAB — URINE DRUG SCREEN, QUALITATIVE (ARMC ONLY)
Amphetamines, Ur Screen: NOT DETECTED
Barbiturates, Ur Screen: NOT DETECTED
Benzodiazepine, Ur Scrn: NOT DETECTED
Cannabinoid 50 Ng, Ur ~~LOC~~: NOT DETECTED
Cocaine Metabolite,Ur ~~LOC~~: NOT DETECTED
MDMA (Ecstasy)Ur Screen: NOT DETECTED
Methadone Scn, Ur: NOT DETECTED
Opiate, Ur Screen: NOT DETECTED
Phencyclidine (PCP) Ur S: NOT DETECTED
Tricyclic, Ur Screen: POSITIVE — AB

## 2024-04-12 LAB — CBC
HCT: 39.5 % (ref 36.0–46.0)
Hemoglobin: 12.2 g/dL (ref 12.0–15.0)
MCH: 26.1 pg (ref 26.0–34.0)
MCHC: 30.9 g/dL (ref 30.0–36.0)
MCV: 84.4 fL (ref 80.0–100.0)
Platelets: 380 K/uL (ref 150–400)
RBC: 4.68 MIL/uL (ref 3.87–5.11)
RDW: 17.4 % — ABNORMAL HIGH (ref 11.5–15.5)
WBC: 10.7 K/uL — ABNORMAL HIGH (ref 4.0–10.5)
nRBC: 0 % (ref 0.0–0.2)

## 2024-04-12 LAB — TROPONIN I (HIGH SENSITIVITY): Troponin I (High Sensitivity): 3 ng/L (ref <18)

## 2024-04-12 LAB — APTT: aPTT: 31 s (ref 24–36)

## 2024-04-12 LAB — ETHANOL: Alcohol, Ethyl (B): <15 mg/dL (ref <15)

## 2024-04-12 MED ORDER — MECLIZINE HCL 25 MG PO TABS
25.0000 mg | ORAL_TABLET | Freq: Once | ORAL | Status: AC
  Administered 2024-04-12: 25 mg via ORAL
  Filled 2024-04-12: qty 1

## 2024-04-12 MED ORDER — MECLIZINE HCL 25 MG PO TABS: 25.0000 mg | ORAL_TABLET | Freq: Three times a day (TID) | ORAL | 0 refills | Status: DC | PRN

## 2024-04-12 MED ORDER — SODIUM CHLORIDE 0.9 % IV BOLUS
1000.0000 mL | Freq: Once | INTRAVENOUS | Status: AC
  Administered 2024-04-12: 1000 mL via INTRAVENOUS

## 2024-04-12 NOTE — ED Triage Notes (Signed)
 Pt sts that she has two episodes of near fainting. Pt sts that she ate something thinking that was it but is was not.

## 2024-04-12 NOTE — Progress Notes (Signed)
  Chaplain On-Call responded to Code Stroke notification at 1409 hours.  The patient was at the CT Scan area for tests.  Chaplain assured ED Staff of availability as needed.  Chaplain Bebe Ardean EMERSON Hershal., [facility]

## 2024-04-12 NOTE — ED Provider Notes (Signed)
 Patient felt significant improvement after IV fluids and medication. Was able to ambulate easily around the room. Tolerated PO. At this time think dehydration likely playing significant role in patient's symptoms. Discussed with patient. Will plan on discharging. Encouraged patient to follow up closely with PCP.   Lisa Roberts, MD 04/12/24 [phone]

## 2024-04-12 NOTE — ED Notes (Signed)
Code  stroke  called  to  [facility] 

## 2024-04-12 NOTE — ED Notes (Addendum)
 ED Stroke Nurse Team Member Burnard Bras advised the pt's MRI was negative and there was no need to continue her NIH or swallow screen.

## 2024-04-12 NOTE — Code Documentation (Signed)
 CODE STROKE- PHARMACY COMMUNICATION   Time CODE STROKE called/page received:1411  Time response to CODE STROKE was made (in person or via phone): 1417  Time Stroke Kit retrieved from Pyxis:not required  Name of Provider/Nurse contacted: Deedra, MD  Past Medical History:  Diagnosis Date   Allergy 7/17   Anemia    Anxiety    Depression    Heart murmur 07/17   Hypertension    Neuromuscular disorder (HCC) 01/23   Prior to Admission medications   Medication Sig Start Date End Date Taking? Authorizing Provider  amitriptyline (ELAVIL) 100 MG tablet Take 100 mg by mouth at bedtime.    [provider]  Azelaic Acid  15 % gel APPLY TOPICALLY TO FACE 1-2 TIMES (EVERY MORNING) FOR ACNE 07/28/23   Jackquline Sawyer, MD  chlorthalidone  (HYGROTON ) 25 MG tablet Take 1 tablet (25 mg total) by mouth daily. 04/05/24   Ziglar, Susan K, MD  cyclobenzaprine  (FLEXERIL ) 5 MG tablet Take 1-2 tablets (5-10 mg total) by mouth 3 (three) times daily as needed. 12/06/23   Menshew, Candida LULLA Kings, PA-C  escitalopram  (LEXAPRO ) 20 MG tablet Take 1 tablet (20 mg total) by mouth daily. 04/05/24   Ziglar, Susan K, MD  hydrALAZINE (APRESOLINE) 10 MG tablet Take 30 mg by mouth 3 (three) times daily.    [provider]  losartan (COZAAR) 100 MG tablet Take 100 mg by mouth daily. 10/29/20   [provider]  methadone (DOLOPHINE) 5 MG tablet Take 5 mg by mouth at bedtime.    [provider]  metoCLOPramide  (REGLAN ) 10 MG tablet Take 1 tablet (10 mg total) by mouth every 8 (eight) hours as needed for nausea. 12/06/23   Menshew, Candida LULLA Kings, PA-C  metoprolol tartrate (LOPRESSOR) 25 MG tablet Take 25 mg by mouth 2 (two) times daily.    [provider]  pregabalin (LYRICA) 100 MG capsule Take 100 mg by mouth 2 (two) times daily.    [provider]  tretinoin  (ALTRALIN) 0.05 % gel Apply topically to face qam for acne. If flared can use twice daily. 04/26/23   Jackquline Sawyer, MD  White  Petrolatum-Mineral Oil (SYSTANE NIGHTTIME) OINT Apply 1 application to eye 4 (four) times daily as needed. 10/29/21   [provider]    Adriana JONETTA Bolster ,PharmD Clinical Pharmacist  04/12/2024  2:13 PM

## 2024-04-12 NOTE — Consult Note (Signed)
 NEUROLOGY CONSULT NOTE   Date of service: April 12, 2024 Patient Name: Lisa Hurst MRN:  [id number] DOB:  1971/06/10 Chief Complaint: Dizziness Requesting Provider: Suzanne Kirsch, MD  History of Present Illness  Lisa Hurst is a 53 y.o. 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 She was at a pizza store collecting pizza and estimates she reached towards the handle of the box, she started experience the room spinning.  She became unsteady and was laid down to the ground by the staff.  She did not hit her head to the ground or experience any trauma.  She also then felt twitching of both her hands-she has had some twitching off and on which is random but this 1 was somewhat more persistent for about less than a minute.  She was aware of these episodes. She was brought in for emergent evaluation and a code stroke was activated for concern for posterior circulation stroke Stat CT head and stat MRI were performed-thankfully both reassuring  LKW: 10:45 AM Modified rankin score: 1-No significant post stroke disability and can perform usual duties with stroke symptoms IV Thrombolysis: Not a stroke EVT: Same as above  NIHSS components Score: Comment  1a Level of Conscious 0[x]  1[]  2[]  3[]      1b LOC Questions 0[]  1[x]  2[]       1c LOC Commands 0[x]  1[]  2[]       2 Best Gaze 0[x]  1[]  2[]       3 Visual 0[x]  1[]  2[]  3[]      4 Facial Palsy 0[]  1[]  2[x]  3[]      5a Motor Arm - left 0[x]  1[]  2[]  3[]  4[]  UN[]    5b Motor Arm - Right 0[x]  1[]  2[]  3[]  4[]  UN[]    6a Motor Leg - Left 0[x]  1[]  2[]  3[]  4[]  UN[]    6b Motor Leg - Right 0[x]  1[]  2[]  3[]  4[]  UN[]    7 Limb Ataxia 0[x]  1[]  2[]  UN[]       8 Sensory 0[x]  1[]  2[]  UN[]      9 Best Language 0[x]  1[]  2[]  3[]      10 Dysarthria 0[]  1[x]  2[]  UN[]      11 Extinct. and Inattention 0[x]  1[]  2[]       TOTAL: 4      ROS  Comprehensive ROS performed and pertinent positives documented in HPI   Past History   Past Medical History:  Diagnosis Date   Allergy 7/17   Anemia    Anxiety    Depression    Heart murmur 07/17   Hypertension    Neuromuscular disorder (HCC) 01/23    Past Surgical History:  Procedure Laterality Date   BRAIN SURGERY     CESAREAN SECTION  03/17/10   COLONOSCOPY WITH PROPOFOL  N/A 01/06/2021   Procedure: COLONOSCOPY WITH PROPOFOL ;  Surgeon: Therisa Bi, MD;  Location: [facility] ENDOSCOPY;  Service: Gastroenterology;  Laterality: N/A;   DILATION AND CURETTAGE OF UTERUS     GASTROSTOMY TUBE PLACEMENT  10/13/2021   HERNIA REPAIR      Family History: Family History  Problem Relation Age of Onset   Anxiety disorder Mother    COPD Mother    Varicose Veins Mother    Anxiety disorder Sister    Depression Sister    Cancer Maternal Aunt  Obesity Sister    Anxiety disorder Sister    Depression Sister    Cancer Maternal Aunt    Obesity Sister    Anxiety disorder Sister    Depression Sister    Cancer Maternal Aunt    Obesity Sister     Social History  reports that she has quit smoking. Her smoking use included cigarettes. She has a 4.5 pack-year smoking history. She has been exposed to tobacco smoke. She has never used smokeless tobacco. She reports that she does not currently use alcohol. She reports that she does not currently use drugs after having used the following drugs: Marijuana.  Allergies  Allergen Reactions   Other Anaphylaxis    Shell FIsh   Apple Juice    Erythromycin Hives    Medications  No current facility-administered medications for this encounter.  Current Outpatient Medications:    amitriptyline (ELAVIL) 100 MG tablet, Take 100 mg by mouth at bedtime., Disp: , Rfl:    Azelaic  Acid 15 % gel, APPLY TOPICALLY TO FACE 1-2 TIMES (EVERY MORNING) FOR ACNE, Disp: 50 g, Rfl: 6   chlorthalidone  (HYGROTON ) 25 MG tablet, Take 1 tablet (25 mg total) by mouth daily., Disp: 90 tablet, Rfl: 1   cyclobenzaprine  (FLEXERIL ) 5 MG tablet, Take 1-2 tablets (5-10 mg total) by mouth 3 (three) times daily as needed., Disp: 30 tablet, Rfl: 1   escitalopram  (LEXAPRO ) 20 MG tablet, Take 1 tablet (20 mg total) by mouth daily., Disp: 90 tablet, Rfl: 1   hydrALAZINE (APRESOLINE) 10 MG tablet, Take 30 mg by mouth 3 (three) times daily., Disp: , Rfl:    losartan (COZAAR) 100 MG tablet, Take 100 mg by mouth daily., Disp: , Rfl:    methadone (DOLOPHINE) 5 MG tablet, Take 5 mg by mouth at bedtime., Disp: , Rfl:    metoCLOPramide  (REGLAN ) 10 MG tablet, Take 1 tablet (10 mg total) by mouth every 8 (eight) hours as needed for nausea., Disp: 30 tablet, Rfl: 1   metoprolol tartrate (LOPRESSOR) 25 MG tablet, Take 25 mg by mouth 2 (two) times daily., Disp: , Rfl:    pregabalin (LYRICA) 100 MG capsule, Take 100 mg by mouth 2 (two) times daily., Disp: , Rfl:    tretinoin  (ALTRALIN) 0.05 % gel, Apply topically to face qam for acne. If flared can use twice daily., Disp: 45 g, Rfl: 5   White Petrolatum-Mineral Oil (SYSTANE NIGHTTIME) OINT, Apply 1 application to eye 4 (four) times daily as needed., Disp: , Rfl:   Vitals   Vitals:   April 22, 2024 1340 22-Apr-2024 1342 April 22, 2024 1350 April 22, 2024 1500  BP:  (!) 81/57 103/61 119/82  Pulse:  (!) 102 (!) 102 96  Resp:  17 11   Temp:  98 F (36.7 C)    TempSrc:  Oral    SpO2:  93% 91% 98%  Weight: 104.3 kg     Height: 5' 4 (1.626 m)       Body mass index is 39.48 kg/m.   Physical Exam  General: Awake alert in no distress HEENT: Normocephalic atraumatic Lungs: Clear Cardiovascular: Regular rhythm Neurological exam Awake alert oriented x 3.  Got the month wrong to June instead of July.  Mild dysarthria.  No aphasia. Cranial nerves II to XII: Chronic left facial  weakness-otherwise no abnormality. Motor examination with no drift in any of the 4 extremities Sensation intact light touch Coordination examination with no dysmetria  Labs/Imaging/Neurodiagnostic studies   CBC:  Recent Labs  Lab 2024/04/22 1352  WBC 10.7*  NEUTROABS 6.3  HGB 12.2  HCT 39.5  MCV 84.4  PLT 380    Basic Metabolic Panel:  Lab Results  Component Value Date   NA 140 04/12/2024   K 3.9 04/12/2024   CO2 28 04/12/2024   GLUCOSE 121 (H) 04/12/2024   BUN 26 (H) 04/12/2024   CREATININE 1.54 (H) 04/12/2024   CALCIUM 9.4 04/12/2024   GFRNONAA 41 (L) 04/12/2024   GFRAA >60 11/17/2019   Alcohol Level     Component Value Date/Time   ETH <15 04/12/2024 1408   INR  Lab Results  Component Value Date   INR 1.0 04/12/2024   APTT  Lab Results  Component Value Date   APTT 31 04/12/2024   CT Head without contrast(Personally reviewed): No acute findings.  Partially calcified extra-axial mass in the left cerebellopontine angle cistern with mass effect on adjacent brainstem and cerebellum suggestive of meningioma.  Mass is smaller in size compared to MRI from 2022 which may reflect interval partial surgical resection.  MR brain imaging personally reviewed-no acute stroke.  Formal read pending  ASSESSMENT   Lisa Hurst is a 53 y.o. female past medical history as above presenting for evaluation of sudden onset vertiginous symptoms followed by some quick jerking movements of both upper extremities with no loss of consciousness.  Code stroke activated for concern for posterior circulation stroke.  MRI brain unremarkable for stroke.  She has mild AKI based on her increased creatinine and BUN-that might of contributed to the vertiginous symptoms. Symptoms likely related to dehydration exacerbating brainstem dysfunction from the meningioma as well as prior surgeries/Chiari malformation. Needs symptomatic management of vertigo and dehydration  RECOMMENDATIONS  Trial of IV  fluids, meclizine . Can also try diazepam Follow-up with outpatient neurology and neurosurgery Do not see the need for further inpatient neurological workup at this time. Please call with questions as needed Plan discussed with Dr. Maury ______________________________________________________________________    Signed, Eligio Lav, MD [facility]

## 2024-04-12 NOTE — Code Documentation (Signed)
 Stroke Response Nurse Documentation Code Documentation  Lisa Hurst is a 53 y.o. female arriving to [name] via [employer] on 04/12/2024 with past medical hx of HTN, heart murmur, chiari malformation, meningona, chronic left facial paralysis. On {meds; anticoagulants:31417}. Code stroke was activated by ED.   Patient from res where she was LKW at 74 and now complaining of sudden onset dizziness. She was at a restaurant when she she started experience the room spinning. She became unsteady and was laid down to the ground by the staff. She reports she then had twitching of both her hands.  Stroke team at the bedside on patient arrival. Labs drawn and patient cleared for CT by Dr. Suzanne. Patient to CT with team. NIHSS 4, see documentation for details and code stroke times. Patient with disoriented, left facial droop, and dysarthria  on exam. The following imaging was completed:  CT Head and MRI. Patient is not a candidate for IV Thrombolytic due to ***. Patient {ACTION; IS/IS WNU:[id number]} a candidate for IR due to ***.   Care Plan: ***.   Process Delays Noted: ***  Bedside handoff with ED RN ***.    Burnard KANDICE Bras  Stroke Response RN

## 2024-04-12 NOTE — ED Provider Notes (Signed)
 [facility] Provider Note    Event Date/Time   First MD Initiated Contact with Patient 04/12/24 1344     (approximate)   History   Near Syncope   HPI  Lisa Hurst is a 53 y.o. 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  States that she was at a restaurant to get pizza today and went to open the door and had a sudden onset of severe room spinning dizziness and felt like the entire world was spinning.  States that she was having difficulty catching her balance and had to have family members help lower her down.  Noted approximately 1 minute of shaking episode.  No urinary or bowel incontinence.  No tongue biting.  States that she has been slow to respond but continues to have some room spinning dizziness and difficulty with her gait.  States that she normally ambulates without any difficulty.  No prior history of a CVA.  No recent fall or trauma.  Normal state of health earlier today.  Intermittent episodes of dizziness in the past 1 prior 1 a couple of months ago.  No chest pain or shortness of breath     Physical Exam   Triage Vital Signs: ED Triage Vitals  Encounter Vitals Group     BP 04/12/24 1342 (!) 81/57     Girls Systolic BP Percentile --      Girls Diastolic BP Percentile --      Boys Systolic BP Percentile --      Boys Diastolic BP Percentile --      Pulse Rate 04/12/24 1342 (!) 102     Resp 04/12/24 1342 17     Temp 04/12/24 1342 98 F (36.7 C)     Temp Source 04/12/24 1342 Oral     SpO2 04/12/24 1342 93 %     Weight 04/12/24 1340 230 lb (104.3 kg)     Height 04/12/24 1340 5' 4 (1.626 m)     Head Circumference --      Peak Flow --       Pain Score 04/12/24 1340 10     Pain Loc --      Pain Education --      Exclude from Growth Chart --     Most recent vital signs: Vitals:   04/12/24 1350 04/12/24 1500  BP: 103/61 119/82  Pulse: (!) 102 96  Resp: 11   Temp:    SpO2: 91% 98%    Physical Exam Constitutional:      Appearance: She is well-developed.  HENT:     Head: Atraumatic.  Eyes:     Extraocular Movements: Extraocular movements intact.     Conjunctiva/sclera: Conjunctivae normal.     Pupils: Pupils are equal, round, and reactive to light.  Cardiovascular:     Rate and Rhythm: Regular rhythm.  Pulmonary:     Effort: No respiratory distress.  Abdominal:     General: There is no distension.     Tenderness: There is no abdominal tenderness.  Musculoskeletal:        General: Normal range of motion.     Cervical back: Normal range of motion.  Skin:    General: Skin is warm.  Neurological:  Mental Status: She is alert. Mental status is at baseline.     GCS: GCS eye subscore is 4. GCS verbal subscore is 5. GCS motor subscore is 6.     Cranial Nerves: Facial asymmetry present.     Sensory: Sensation is intact.     Coordination: Finger-Nose-Finger Test normal.     Gait: Gait abnormal.     IMPRESSION / MDM / ASSESSMENT AND PLAN / ED COURSE  I reviewed the triage vital signs and the nursing notes.  Patient was made an activated code stroke after my evaluation given sudden onset of vertiginous symptoms and gait instability.  Patient was immediately evaluated by neurology with Dr. Deedra  Patient went immediately to CT scan of the head and MRI  MRI of the brain showed no findings of acute ischemia.  EKG  I, Clotilda Punter, the attending physician, personally viewed and interpreted this ECG.  Sinus tachycardia with a heart rate of 101.  No significant ST elevation or depression.  No findings of acute ischemia or dysrhythmia.  No significant change when compared to prior  Sinus tachycardia while  on cardiac telemetry.  RADIOLOGY I independently reviewed imaging, my interpretation of imaging: CT head no signs of bleeding.    LABS (all labs ordered are listed, but only abnormal results are displayed) Labs interpreted as -    Labs Reviewed  COMPREHENSIVE METABOLIC PANEL WITH GFR - Abnormal; Notable for the following components:      Result Value   Chloride 96 (*)    Glucose, Bld 121 (*)    BUN 26 (*)    Creatinine, Ser 1.54 (*)    Total Protein 8.3 (*)    GFR, Estimated 41 (*)    Anion gap 16 (*)    All other components within normal limits  CBC - Abnormal; Notable for the following components:   WBC 10.7 (*)    RDW 17.4 (*)    All other components within normal limits  DIFFERENTIAL - Abnormal; Notable for the following components:   Abs Immature Granulocytes 0.08 (*)    All other components within normal limits  CBG MONITORING, ED - Abnormal; Notable for the following components:   Glucose-Capillary 114 (*)    All other components within normal limits  ETHANOL  PROTIME-INR  APTT  URINE DRUG SCREEN, QUALITATIVE ([facility] ONLY)  TROPONIN I (HIGH SENSITIVITY)     MDM  Initially blood pressure was reading as 81/57 but immediately repeated and was 100/61.  Did have some tachycardia.  No fever.  Activated code stroke was immediately evaluated by neurology.  CT scan of the head with no signs of intracranial hemorrhage.  Emergent MRI was obtained with no signs of acute infarct.  Not a TNK candidate.  No signs of an LVO.  Recommended symptomatic treatment for vertiginous symptoms.  Patient was given IV fluids and meclizine .  EKG without findings of acute ischemia or dysrhythmia.  Have a low suspicion for infectious symptoms given sudden onset of symptoms.  Neurology felt that it was most likely secondary to her resection and meningioma.  Did not feel that she likely had a seizure and did not recommend an EEG at this time.  If she did have recurrent episode states that at  that time would consider EEG.  Recommended symptomatic treatment and if no improvement and continues to not be able to ambulate recommended admission for PT and OT.  Care transferred to incoming provider.     PROCEDURES:  Critical Care performed:  yes  .Critical Care  Performed by: Suzanne Kirsch, MD Authorized by: Suzanne Kirsch, MD   Critical care provider statement:    Critical care time (minutes):  30   Critical care time was exclusive of:  Separately billable procedures and treating other patients   Critical care was necessary to treat or prevent imminent or life-threatening deterioration of the following conditions:  CNS failure or compromise   Critical care was time spent personally by me on the following activities:  Development of treatment plan with patient or surrogate, discussions with consultants, evaluation of patient's response to treatment, examination of patient, ordering and review of laboratory studies, ordering and review of radiographic studies, ordering and performing treatments and interventions, pulse oximetry, re-evaluation of patient's condition and review of old charts   Patient's presentation is most consistent with acute presentation with potential threat to life or bodily function.   MEDICATIONS ORDERED IN ED: Medications  sodium chloride  0.9 % bolus 1,000 mL (1,000 mLs Intravenous New Bag/Given 04/12/24 1509)  meclizine  (ANTIVERT ) tablet 25 mg (25 mg Oral Given 04/12/24 1524)    FINAL CLINICAL IMPRESSION(S) / ED DIAGNOSES   Final diagnoses:  Near syncope  Vertigo     Rx / DC Orders   ED Discharge Orders     None        Note:  This document was prepared using Dragon voice recognition software and may include unintentional dictation errors.   Suzanne Kirsch, MD 04/12/24 [phone]

## 2024-04-14 ENCOUNTER — Ambulatory Visit: Payer: Self-pay | Admitting: *Deleted

## 2024-04-14 ENCOUNTER — Telehealth (INDEPENDENT_AMBULATORY_CARE_PROVIDER_SITE_OTHER): Admitting: Family Medicine

## 2024-04-14 DIAGNOSIS — I10 Essential (primary) hypertension: Secondary | ICD-10-CM | POA: Diagnosis not present

## 2024-04-14 MED ORDER — HYDRALAZINE HCL 10 MG PO TABS: 20.0000 mg | ORAL_TABLET | Freq: Three times a day (TID) | ORAL | 2 refills | Status: DC

## 2024-04-14 NOTE — Assessment & Plan Note (Signed)
 She takes losartan 100 mg, metoprolol tartrate 25 mg twice daily and hydralazine  10 mg 3 times daily.  Adding chlorthalidone  25 mg caused her to become dehydrated and presyncopal.  Stop the chlorthalidone .  Increase hydralazine  to 20 mg 3 times daily.  Please check your blood pressure at home and record.  Follow-up in a month to reassess your blood pressure.

## 2024-04-14 NOTE — Telephone Encounter (Signed)
 FYI Only or Action Required?: FYI only for provider.  Patient was last seen in primary care on 04/05/2024 by Ziglar, Susan K, MD.  Called Nurse Triage reporting Medication Problem.  Symptoms began several days ago.  Interventions attempted: Rest, hydration, or home remedies.  Symptoms are: unchanged.  Triage Disposition: Call PCP Now  Patient/caregiver understands and will follow disposition?: Yes   Reason for Disposition  [1] Caller has URGENT medicine question about med that primary care doctor (or NP/PA) or specialist prescribed AND [2] triager unable to answer question  Answer Assessment - Initial Assessment Questions 1. NAME of MEDICINE: What medicine(s) are you calling about?     chlorthalidone  (HYGROTON ) 25 MG tablet  2. QUESTION: What is your question? (e.g., double dose of medicine, side effect)     Patient started medication 04/05/24- patient noticed SE 2 weeks ago 3. PRESCRIBER: Who prescribed the medicine? Reason: if prescribed by specialist, call should be referred to that group.     PCP 4. SYMPTOMS: Do you have any symptoms? If Yes, ask: What symptoms are you having?  How bad are the symptoms (e.g., mild, moderate, severe)     Had to go to ED - low BP 91/56  Protocols used: Medication Question Call-A-AH   Copied from CRM [id number]. Topic: Clinical - Red Word Triage >> Apr 14, 2024  9:21 AM Larissa RAMAN wrote: [facility] that prompted transfer to Nurse Triage: chlorthalidone  (HYGROTON ) 25 MG tablet caused BP to drop 86/41 and dehydration

## 2024-04-14 NOTE — Progress Notes (Signed)
   Established Patient Office Visit  Subjective   Patient ID: Lisa Hurst, female    DOB: [birth year]  Age: 53 y.o. MRN: [id number]  Chief complaint: HTN and presyncope   Virtual Visit via Video Note  I connected with Lisa Hurst on 04/14/24 at 10:30 AM EDT by a video enabled telemedicine application and verified that I am speaking with the correct person using two identifiers.  Location: Patient: car, front passengers seat Provider: Home   I discussed the limitations of evaluation and management by telemedicine and the availability of in person appointments. The patient expressed understanding and agreed to proceed.  History of Present Illness:  [name] 53-yo with HTN, anxiety, GERD, migraine headaches, Chiari malformation, s/p  resection of syrinx of the spinal cord, s/p craniotomy for partial resection of a posterior fossa meningioma with resultant chronic left facial paralysis , left vocal cord paralysis, left pharyngeal and neuropathic chronic pain syndrome  (pain of her left face, left neck, left shoulder and left arm).  Her BP was 167/113 at office visit 04/05/2024 and patient reported that she was compliant with metoprolol tartrate 25mg  BID, losartan 100mg  every day and hydralazine  10mg  TID.  She stated that her BP was elevated because she was in so much pain that day.  We increased her BP regimen by adding chlorthalidone  25mg  daily.  She took this for about a week and then had a presyncopal episode.  She did not lose consciousness and she was lowered to the ground by a family member.  She did not strike her head.  When she got to the ER her BP was 80's over 40's.  She was found to be dehydrated.  Her BP improved after IV fluids.  She reports that she thought she was drinking lots of fluids.  She has not taken the chlorthalidone  since her syncopal event.   She had an appointment with pain management today and reports her BP there was 130's over 80's.           The ASCVD Risk  score (Arnett DK, et al., 2019) failed to calculate for the following reasons:   Cannot find a previous HDL lab   Cannot find a previous total cholesterol lab    Assessment & Plan:     I discussed the assessment and treatment plan with the patient. The patient was provided an opportunity to ask questions and all were answered. The patient agreed with the plan and demonstrated an understanding of the instructions.   The patient was advised to call back or seek an in-person evaluation if the symptoms worsen or if the condition fails to improve as anticipated.   Essential hypertension Assessment & Plan: She takes losartan 100 mg, metoprolol tartrate 25 mg twice daily and hydralazine  10 mg 3 times daily.  Adding chlorthalidone  25 mg caused her to become dehydrated and presyncopal.  Stop the chlorthalidone .  Increase hydralazine  to 20 mg 3 times daily.  Please check your blood pressure at home and record.  Follow-up in a month to reassess your blood pressure.  Orders: -     hydrALAZINE  HCl; Take 2 tablets (20 mg total) by mouth 3 (three) times daily.  Dispense: 180 tablet; Refill: 2     Return in about 1 month (around 05/15/2024) for BP recheck.   I provided 15 minutes of non-face-to-face time during this encounter.   Elexa Kivi K Bria Portales, MD

## 2024-04-19 ENCOUNTER — Ambulatory Visit
Admission: RE | Admit: 2024-04-19 | Discharge: 2024-04-19 | Disposition: A | Discharge status: Home / Self Care | Source: Ambulatory Visit | Attending: Neurosurgery | Admitting: Neurosurgery

## 2024-04-19 ENCOUNTER — Ambulatory Visit: Admission: RE | Admit: 2024-04-19 | Source: Ambulatory Visit

## 2024-04-19 DIAGNOSIS — G950 Syringomyelia and syringobulbia: Secondary | ICD-10-CM

## 2024-04-19 DIAGNOSIS — D329 Benign neoplasm of meninges, unspecified: Secondary | ICD-10-CM | POA: Diagnosis present

## 2024-04-19 MED ORDER — GADOBUTROL 1 MMOL/ML IV SOLN
10.0000 mL | Freq: Once | INTRAVENOUS | Status: AC | PRN
  Administered 2024-04-19: 10 mL via INTRAVENOUS

## 2024-04-20 ENCOUNTER — Ambulatory Visit (INDEPENDENT_AMBULATORY_CARE_PROVIDER_SITE_OTHER): Admitting: Family Medicine

## 2024-04-20 ENCOUNTER — Encounter: Payer: Self-pay | Admitting: Family Medicine

## 2024-04-20 VITALS — BP 149/102 | HR 93 | Temp 98.1°F | Resp 18 | Ht 64.0 in | Wt 237.0 lb

## 2024-04-20 DIAGNOSIS — F419 Anxiety disorder, unspecified: Secondary | ICD-10-CM

## 2024-04-20 DIAGNOSIS — I10 Essential (primary) hypertension: Secondary | ICD-10-CM

## 2024-04-20 MED ORDER — HYDROCHLOROTHIAZIDE 12.5 MG PO TABS: 12.5000 mg | ORAL_TABLET | Freq: Every day | ORAL | 3 refills | Status: DC

## 2024-04-20 NOTE — Progress Notes (Signed)
 Established Patient Office Visit  Subjective   Patient ID: Lisa Hurst, female    DOB: 1971-07-02  Age: 53 y.o. MRN: [id number]  Chief Complaint  Patient presents with   Hospitalization Follow-up    HPI [name] 53 YO with hypertension, anxiety, GERD, migraine, chronic pain syndrome (s/p  resection of syrinx of the spinal cord and meningioma, s/p craniotomy for resection of a posterior fossa mass).  She has a chronic pain syndrome of her left face left neck left shoulder and left arm.    Her blood pressure has been uncontrolled.  She is on losartan 100 mg, metoprolol tartrate 25 mg twice daily and hydralazine  30 mg 3 times daily.  We added chlorthalidone  25 mg daily and she ended up in the ER with presyncope and low blood pressure.  She was given IV fluids and her blood pressure responded.  She thinks that she is drinking enough water but apparently she has not been.  She has not taken the chlorthalidone  since this happened.  She has had no blood pressure medicine today and her blood pressure is 159/109.  She does check her blood pressure at home and has not above the elbow fully automatic cuff. She had MRI of her head and neck done yesterday.  She will follow-up with pain management about these reports. Her PHQ-9 score is 12 and her GAD-7 score is 17 however she reports her anxiety is better since increasing escitalopram  to 20 mg daily.  She denies suicidal and homicidal ideation.   Objective:     BP (!) 149/102 (BP Location: Left Arm, Patient Position: Sitting, Cuff Size: Normal)   Pulse 93   Temp 98.1 F (36.7 C) (Oral)   Resp 18   Ht 5' 4 (1.626 m)   Wt 237 lb (107.5 kg)   LMP 02/15/2021 (Exact Date)   SpO2 98%   BMI 40.68 kg/m    Physical Exam Vitals reviewed.  Constitutional:      Appearance: Normal appearance.  HENT:     Head: Normocephalic.  Eyes:     General:        Right eye: No discharge.        Left eye: No discharge.  Cardiovascular:     Rate and  Rhythm: Normal rate.  Pulmonary:     Effort: Pulmonary effort is normal.  Neurological:     Mental Status: She is alert and oriented to person, place, and time.  Psychiatric:        Mood and Affect: Mood normal.        Behavior: Behavior normal.        Thought Content: Thought content normal.        Judgment: Judgment normal.          No results found for any visits on 04/20/24.    The ASCVD Risk score (Arnett DK, et al., 2019) failed to calculate for the following reasons:   Cannot find a previous HDL lab   Cannot find a previous total cholesterol lab    Assessment & Plan:  Essential hypertension Assessment & Plan: Blood pressure is not well-controlled.  Continue losartan 100 mg daily and metoprolol tartrate 25 mg twice daily.  Stop hydralazine  30 mg 3 times daily.  Stop chlorthalidone  25 mg daily.  Add HCTZ 12.5 mg daily.  Please check your blood pressure at home.  Follow-up in a month.  Orders: -     hydroCHLOROthiazide ; Take 1 tablet (12.5 mg total) by mouth daily.  Dispense: 30 tablet; Refill: 3  Anxiety Assessment & Plan: Increased her escitalopram  to 20 mg and she reports that her anxiety is improved.  PHQ-9 score is 12 and GAD-7 score 17.  Follow-up in a month.      Return in about 4 weeks (around 05/18/2024).    Nichol Ator K Hazem Kenner, MD

## 2024-04-20 NOTE — Assessment & Plan Note (Signed)
 Blood pressure is not well-controlled.  Continue losartan 100 mg daily and metoprolol tartrate 25 mg twice daily.  Stop hydralazine  30 mg 3 times daily.  Stop chlorthalidone  25 mg daily.  Add HCTZ 12.5 mg daily.  Please check your blood pressure at home.  Follow-up in a month.

## 2024-04-20 NOTE — Assessment & Plan Note (Signed)
 Increased her escitalopram  to 20 mg and she reports that her anxiety is improved.  PHQ-9 score is 12 and GAD-7 score 17.  Follow-up in a month.

## 2024-05-05 ENCOUNTER — Ambulatory Visit: Admitting: Family Medicine

## 2024-05-09 ENCOUNTER — Ambulatory Visit: Admitting: Family Medicine

## 2024-05-17 ENCOUNTER — Ambulatory Visit: Payer: Self-pay

## 2024-05-17 ENCOUNTER — Emergency Department

## 2024-05-17 ENCOUNTER — Other Ambulatory Visit: Payer: Self-pay

## 2024-05-17 DIAGNOSIS — S0990XA Unspecified injury of head, initial encounter: Secondary | ICD-10-CM | POA: Diagnosis present

## 2024-05-17 DIAGNOSIS — Z760 Encounter for issue of repeat prescription: Secondary | ICD-10-CM | POA: Insufficient documentation

## 2024-05-17 DIAGNOSIS — W19XXXA Unspecified fall, initial encounter: Secondary | ICD-10-CM | POA: Diagnosis not present

## 2024-05-17 DIAGNOSIS — Y92810 Car as the place of occurrence of the external cause: Secondary | ICD-10-CM | POA: Diagnosis not present

## 2024-05-17 NOTE — Telephone Encounter (Signed)
 FYI Only or Action Required?: FYI only for provider.  Patient was last seen in primary care on 04/20/2024 by Ziglar, Susan K, MD.  Called Nurse Triage reporting Dizziness.  Symptoms began today.  Interventions attempted: Nothing.  Symptoms are: gradually worsening.  Triage Disposition: Go to ED Now (or PCP Triage)  Patient/caregiver understands and will follow disposition?: Yes  **See note below**            Copied from CRM #[id number]. Topic: Appointments - Appointment Scheduling >> May 17, 2024  4:29 PM Tonda B wrote: Patient/patient representative is calling to schedule an appointment. Refer to attachments for appointment information.  Pt bp is high 154/101 and dizziness and passing out Reason for Disposition  SEVERE dizziness (e.g., unable to stand, requires support to walk, feels like passing out now)  Answer Assessment - Initial Assessment Questions 1. DESCRIPTION: Describe your dizziness.      Dizziness upon standing, shaking  2. LIGHTHEADED: Do you feel lightheaded? (e.g., somewhat faint, woozy, weak upon standing)     Yes  3. VERTIGO: Do you feel like either you or the room is spinning or tilting? (i.e., vertigo)     Yes  4. SEVERITY: How bad is it?  Do you feel like you are going to faint? Can you stand and walk?     No   5. ONSET:  When did the dizziness begin?       RN did not finish triage based on the severity of the dizziness, and once informed patient fainted earlier today, she was advised to seek care in the ED, she stated she does not have a ride, I offered to dial 911, she stated she will now.  Protocols used: Dizziness - Lightheadedness-A-AH

## 2024-05-17 NOTE — ED Triage Notes (Signed)
 Pt reports fall tonight, pt states generalized soreness to her legs and pain to the back of her head. Pt denies LOC, denies taking blood thinners.

## 2024-05-18 ENCOUNTER — Emergency Department
Admission: EM | Admit: 2024-05-18 | Discharge: 2024-05-18 | Disposition: A | Discharge status: Home / Self Care | Attending: Emergency Medicine | Admitting: Emergency Medicine

## 2024-05-18 DIAGNOSIS — W19XXXA Unspecified fall, initial encounter: Secondary | ICD-10-CM

## 2024-05-18 DIAGNOSIS — S0990XA Unspecified injury of head, initial encounter: Secondary | ICD-10-CM

## 2024-05-18 MED ORDER — MECLIZINE HCL 25 MG PO TABS: 25.0000 mg | ORAL_TABLET | Freq: Three times a day (TID) | ORAL | 1 refills | Status: AC | PRN

## 2024-05-18 MED ORDER — OXYCODONE HCL 5 MG PO TABS
5.0000 mg | ORAL_TABLET | Freq: Once | ORAL | Status: AC
  Administered 2024-05-18: 5 mg via ORAL
  Filled 2024-05-18: qty 1

## 2024-05-18 MED ORDER — ACETAMINOPHEN 500 MG PO TABS
1000.0000 mg | ORAL_TABLET | Freq: Once | ORAL | Status: AC
  Administered 2024-05-18: 1000 mg via ORAL
  Filled 2024-05-18: qty 2

## 2024-05-18 NOTE — ED Provider Notes (Signed)
 [facility] Provider Note    Event Date/Time   First MD Initiated Contact with Patient 05/18/24 0145     (approximate)   History   Fall   HPI  Lisa Hurst is a 53 y.o. female who presents to the ED for evaluation of Fall   I review a rheumatology outpatient consult from 10 days ago.  History of chronic pain syndrome, spinal cord syrinx and meningioma s/p craniotomy for resection of syrinx and resection of posterior fossa mass.    Patient presents for evaluation of a fall with occipital injury to make sure she is okay after her remote procedures.  Reports that she has chronic instability, chronic falls and chronic dizziness.  Often improved with meclizine  and she requests a refill of this medication.  Reports a typical episode of vertigo and falling when getting out of her car today, struck the back of her head.  No syncope.  No other injury.   Physical Exam   Triage Vital Signs: ED Triage Vitals  Encounter Vitals Group     BP 05/17/24 2310 (!) 159/100     Girls Systolic BP Percentile --      Girls Diastolic BP Percentile --      Boys Systolic BP Percentile --      Boys Diastolic BP Percentile --      Pulse Rate 05/17/24 2310 94     Resp 05/17/24 2310 17     Temp 05/17/24 2310 98.8 F (37.1 C)     Temp Source 05/17/24 2310 Oral     SpO2 05/17/24 2310 96 %     Weight 05/17/24 2309 232 lb (105.2 kg)     Height 05/17/24 2309 5' 4 (1.626 m)     Head Circumference --      Peak Flow --      Pain Score 05/17/24 2309 9     Pain Loc --      Pain Education --      Exclude from Growth Chart --     Most recent vital signs: Vitals:   05/18/24 0239 05/18/24 0424  BP: 124/82 (!) 140/103  Pulse: 87 83  Resp: 16 18  Temp:    SpO2: 97% 93%    General: Awake, no distress.  CV:  Good peripheral perfusion.  Resp:  Normal effort.  Abd:  No distention.  MSK:  No deformity noted.  Neuro:  No focal deficits appreciated. Other:     ED Results /  Procedures / Treatments   Labs (all labs ordered are listed, but only abnormal results are displayed) Labs Reviewed - No data to display  EKG   RADIOLOGY CT head interpreted by me without evidence of acute intracranial pathology CT cervical spine interpreted by me without evidence of fracture or dislocation  Official radiology report(s): CT Head Wo Contrast Result Date: 05/17/2024 CLINICAL DATA:  Recent fall with headaches and neck pain, initial encounter EXAM: CT HEAD WITHOUT CONTRAST CT CERVICAL SPINE WITHOUT CONTRAST TECHNIQUE: Multidetector CT imaging of the head and cervical spine was performed following the standard protocol without intravenous contrast. Multiplanar CT image reconstructions of the cervical spine were also generated. RADIATION DOSE REDUCTION: This exam was performed according to the departmental dose-optimization program which includes automated exposure control, adjustment of the mA and/or kV according to patient size and/or use of iterative reconstruction technique. COMPARISON:  04/12/2024 FINDINGS: CT HEAD FINDINGS Brain: Heavily calcified lesion is noted arising from the left petrous ridge stable in  appearance from the prior exam consistent with meningioma. No acute hemorrhage or acute infarction is seen. Vascular: No hyperdense vessel or unexpected calcification. Skull: Postsurgical changes in the left occipital bone laterally. Sinuses/Orbits: No acute finding. Other: None CT CERVICAL SPINE FINDINGS Alignment: Mild straightening of the normal cervical lordosis is noted. This may be related to muscular spasm. Skull base and vertebrae: 7 cervical segments are well visualized. Vertebral body height is well maintained. No acute fracture or acute facet abnormality is noted. Soft tissues and spinal canal: Surrounding soft tissue structures are within normal limits. Upper chest: Visualized lung apices are unremarkable. Other: None IMPRESSION: CT of the head: Stable meningioma along  the left petrous ridge. No acute abnormality noted. CT of the cervical spine: No acute bony abnormality noted. Mild straightening of the normal cervical lordosis which may be related to muscular spasm. Electronically Signed   By: Oneil Devonshire M.D.   On: 05/17/2024 23:58   CT Cervical Spine Wo Contrast Result Date: 05/17/2024 CLINICAL DATA:  Recent fall with headaches and neck pain, initial encounter EXAM: CT HEAD WITHOUT CONTRAST CT CERVICAL SPINE WITHOUT CONTRAST TECHNIQUE: Multidetector CT imaging of the head and cervical spine was performed following the standard protocol without intravenous contrast. Multiplanar CT image reconstructions of the cervical spine were also generated. RADIATION DOSE REDUCTION: This exam was performed according to the departmental dose-optimization program which includes automated exposure control, adjustment of the mA and/or kV according to patient size and/or use of iterative reconstruction technique. COMPARISON:  04/12/2024 FINDINGS: CT HEAD FINDINGS Brain: Heavily calcified lesion is noted arising from the left petrous ridge stable in appearance from the prior exam consistent with meningioma. No acute hemorrhage or acute infarction is seen. Vascular: No hyperdense vessel or unexpected calcification. Skull: Postsurgical changes in the left occipital bone laterally. Sinuses/Orbits: No acute finding. Other: None CT CERVICAL SPINE FINDINGS Alignment: Mild straightening of the normal cervical lordosis is noted. This may be related to muscular spasm. Skull base and vertebrae: 7 cervical segments are well visualized. Vertebral body height is well maintained. No acute fracture or acute facet abnormality is noted. Soft tissues and spinal canal: Surrounding soft tissue structures are within normal limits. Upper chest: Visualized lung apices are unremarkable. Other: None IMPRESSION: CT of the head: Stable meningioma along the left petrous ridge. No acute abnormality noted. CT of the  cervical spine: No acute bony abnormality noted. Mild straightening of the normal cervical lordosis which may be related to muscular spasm. Electronically Signed   By: Oneil Devonshire M.D.   On: 05/17/2024 23:58    PROCEDURES and INTERVENTIONS:  Procedures  Medications  acetaminophen  (TYLENOL ) tablet 1,000 mg (1,000 mg Oral Given 05/18/24 0422)  oxyCODONE  (Oxy IR/ROXICODONE ) immediate release tablet 5 mg (5 mg Oral Given 05/18/24 0422)     IMPRESSION / MDM / ASSESSMENT AND PLAN / ED COURSE  I reviewed the triage vital signs and the nursing notes.  Differential diagnosis includes, but is not limited to, fracture, ICH, stroke, other injury  {Patient presents with symptoms of an acute illness or injury that is potentially life-threatening.  Patient presents after a fall and head injury without evidence of acute pathology and suitable for outpatient management.  Reassuring imaging.  No evidence of additional trauma on exam.  Suitable for outpatient management and discharged with a refill of meclizine       FINAL CLINICAL IMPRESSION(S) / ED DIAGNOSES   Final diagnoses:  Fall, initial encounter  Injury of head, initial encounter  Rx / DC Orders   ED Discharge Orders          Ordered    meclizine  (ANTIVERT ) 25 MG tablet  3 times daily PRN        05/18/24 0358             Note:  This document was prepared using Dragon voice recognition software and may include unintentional dictation errors.   Claudene Rover, MD 05/18/24 [phone]

## 2024-05-22 ENCOUNTER — Ambulatory Visit: Admitting: Family Medicine

## 2024-05-22 ENCOUNTER — Ambulatory Visit: Payer: Self-pay

## 2024-05-22 NOTE — Telephone Encounter (Signed)
 FYI Only or Action Required?: FYI only for provider.  Patient was last seen in primary care on 04/20/2024 by Ziglar, Devere POUR, MD.  Called Nurse Triage reporting Fall, med changes, Hypertension, Dizziness, Head Injury, sounds breathless on phone, numbness that worsened recently because of water pill, Headache, Blurred Vision, and Generalized Body Aches.  Symptoms began several days ago.  Interventions attempted: Prescription medications: stopped taking her water pill on her own, Rest, hydration, or home remedies, and Other: ED visit 8/14.  Symptoms are: rapidly worsening.  Triage Disposition: Go to ED Now (Notify PCP)  Patient/caregiver understands and will follow disposition?: Yes     Copied from CRM #[id number]. Topic: Clinical - Red Word Triage >> May 22, 2024 11:20 AM Mia F wrote: Red Word that prompted transfer to Nurse Triage: [place] and hit her head recently. Not sure what caused it. She says she did stop taking one of her bp medications and her bp has been high. Today it is 162/102. She believes the falls may be coming from the water pill she was taking so she stopped it. She mentions blacking out and being dizzy when taking the water pill. Sounds very short on breath per agent Reason for Disposition  [1] Systolic BP >= 160 OR Diastolic >= 100 AND [2] cardiac (e.g., breathing difficulty, chest pain) or neurologic symptoms (e.g., new-onset blurred or double vision, unsteady gait)  Answer Assessment - Initial Assessment Questions 1. BLOOD PRESSURE: What is your blood pressure? Did you take at least two measurements 5 minutes apart?     162/102 at 10:30 am, 159/100 at 8:30 am 2. ONSET: When did you take your blood pressure?     Every other day it's sky high 3. HOW: How did you take your blood pressure? (e.g., automatic home BP monitor, visiting nurse)     At home 4. HISTORY: Do you have a history of high blood pressure?     yes 5. MEDICINES: Are you taking any medicines  for blood pressure? Have you missed any doses recently?     no 6. OTHER SYMPTOMS: Do you have any symptoms? (e.g., blurred vision, chest pain, difficulty breathing, headache, weakness)     When taking water pill blacking out and being dizzy Blurred vision but don't know No chest pain Feel like have the flu but without the flu, body aches Dizziness always feel like going to fall or pass out Denies SOB but sounds breathless over phone Headache yes Also have something else going on, miserable right now Numbness because deal with paralysis, but everything's normal other than that, but was worse over past few days because of water pill Want to come to appt but ride keeps skipping on me, can't drive when pressure's high  They let me walk out of there with BP high, had all these symptoms then too I'll try to go hospital now, but keep going back and forth  Protocols used: Blood Pressure - High-A-AH

## 2024-05-27 ENCOUNTER — Other Ambulatory Visit: Payer: Self-pay

## 2024-05-27 ENCOUNTER — Emergency Department
Admission: EM | Admit: 2024-05-27 | Discharge: 2024-05-27 | Disposition: A | Discharge status: Home / Self Care | Attending: Emergency Medicine | Admitting: Emergency Medicine

## 2024-05-27 DIAGNOSIS — X58XXXA Exposure to other specified factors, initial encounter: Secondary | ICD-10-CM | POA: Insufficient documentation

## 2024-05-27 DIAGNOSIS — S161XXA Strain of muscle, fascia and tendon at neck level, initial encounter: Secondary | ICD-10-CM | POA: Diagnosis not present

## 2024-05-27 DIAGNOSIS — I10 Essential (primary) hypertension: Secondary | ICD-10-CM | POA: Diagnosis not present

## 2024-05-27 DIAGNOSIS — M542 Cervicalgia: Secondary | ICD-10-CM | POA: Diagnosis present

## 2024-05-27 HISTORY — DX: Occipital neuralgia: M54.81

## 2024-05-27 LAB — CBC WITH DIFFERENTIAL/PLATELET
Abs Immature Granulocytes: 0.03 K/uL (ref 0.00–0.07)
Basophils Absolute: 0 K/uL (ref 0.0–0.1)
Basophils Relative: 1 %
Eosinophils Absolute: 0.1 K/uL (ref 0.0–0.5)
Eosinophils Relative: 2 %
HCT: 34.8 % — ABNORMAL LOW (ref 36.0–46.0)
Hemoglobin: 10.8 g/dL — ABNORMAL LOW (ref 12.0–15.0)
Immature Granulocytes: 1 %
Lymphocytes Relative: 29 %
Lymphs Abs: 1.8 K/uL (ref 0.7–4.0)
MCH: 26.4 pg (ref 26.0–34.0)
MCHC: 31 g/dL (ref 30.0–36.0)
MCV: 85.1 fL (ref 80.0–100.0)
Monocytes Absolute: 0.4 K/uL (ref 0.1–1.0)
Monocytes Relative: 7 %
Neutro Abs: 3.8 K/uL (ref 1.7–7.7)
Neutrophils Relative %: 60 %
Platelets: 338 K/uL (ref 150–400)
RBC: 4.09 MIL/uL (ref 3.87–5.11)
RDW: 17.2 % — ABNORMAL HIGH (ref 11.5–15.5)
WBC: 6.2 K/uL (ref 4.0–10.5)
nRBC: 0 % (ref 0.0–0.2)

## 2024-05-27 LAB — BASIC METABOLIC PANEL WITH GFR
Anion gap: 12 (ref 5–15)
BUN: 13 mg/dL (ref 6–20)
CO2: 26 mmol/L (ref 22–32)
Calcium: 9.4 mg/dL (ref 8.9–10.3)
Chloride: 103 mmol/L (ref 98–111)
Creatinine, Ser: 0.87 mg/dL (ref 0.44–1.00)
GFR, Estimated: >60 mL/min (ref >60)
Glucose, Bld: 121 mg/dL — ABNORMAL HIGH (ref 70–99)
Potassium: 4.2 mmol/L (ref 3.5–5.1)
Sodium: 141 mmol/L (ref 135–145)

## 2024-05-27 LAB — TROPONIN I (HIGH SENSITIVITY): Troponin I (High Sensitivity): <2 ng/L (ref <18)

## 2024-05-27 MED ORDER — NAPROXEN 500 MG PO TABS
500.0000 mg | ORAL_TABLET | Freq: Once | ORAL | Status: AC
  Administered 2024-05-27: 500 mg via ORAL
  Filled 2024-05-27: qty 1

## 2024-05-27 MED ORDER — METHOCARBAMOL 500 MG PO TABS
500.0000 mg | ORAL_TABLET | Freq: Once | ORAL | Status: AC
  Administered 2024-05-27: 500 mg via ORAL
  Filled 2024-05-27: qty 1

## 2024-05-27 MED ORDER — METHOCARBAMOL 500 MG PO TABS: 500.0000 mg | ORAL_TABLET | Freq: Four times a day (QID) | ORAL | 0 refills | Status: AC

## 2024-05-27 MED ORDER — NAPROXEN 500 MG PO TABS: 500.0000 mg | ORAL_TABLET | Freq: Two times a day (BID) | ORAL | 0 refills | Status: AC

## 2024-05-27 NOTE — ED Triage Notes (Signed)
 Pt to ED for hypertension and HA. Pt had brain tumor surgery 3 years ago and since then has had occipital neuralgia with ongoing severe constant pain to L side head mostly behind L ear like a lighter burning. Pt endorses intermittent bilateral blurry vision. Pt states BP was 197/122 today at home. 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

## 2024-05-27 NOTE — ED Notes (Signed)
 Lab called to add on troponin

## 2024-05-27 NOTE — ED Provider Notes (Signed)
 [facility] Provider Note    Event Date/Time   First MD Initiated Contact with Patient 05/27/24 2204     (approximate)   History   Chief Complaint: Hypertension and Headache   HPI  Lisa Hurst is a 53 y.o. female with chronic hypertension and left occipital neuralgia who comes ED with pain at the left side of the neck for the last few days.  Reports no change in her chronic headaches.  Pain is worse with turning her head.  Tried heat therapy without relief.  Denies trauma or change in hearing or ear pain.  No vision changes, no neck stiffness, no paresthesia        Past Medical History:  Diagnosis Date   Allergy 7/17   Anemia    Anxiety    Depression    Heart murmur 07/17   Hypertension    Neuromuscular disorder (HCC) 01/23   L facial paralysis since brain surgery   Occipital neuralgia    since 2022    Current Outpatient Rx   Order #: [id number] Class: Normal   Order #: [id number] Class: Normal   Order #: [id number] Class: Historical Med   Order #: [id number] Class: Normal   Order #: [id number] Class: Normal   Order #: [id number] Class: Normal   Order #: [id number] Class: Normal   Order #: [id number] Class: Historical Med   Order #: [id number] Class: Normal   Order #: [id number] Class: Normal   Order #: [id number] Class: Historical Med   Order #: [id number] Class: Normal   Order #: [id number] Class: Historical Med   Order #: [id number] Class: Historical Med   Order #: [id number] Class: Normal   Order #: [id number] Class: Historical Med    Past Surgical History:  Procedure Laterality Date   BRAIN SURGERY  2022   brain tumor removed   CESAREAN SECTION  03/17/10   COLONOSCOPY WITH PROPOFOL  N/A 01/06/2021   Procedure: COLONOSCOPY WITH PROPOFOL ;  Surgeon: Therisa Bi, MD;  Location: [facility] ENDOSCOPY;  Service: Gastroenterology;  Laterality: N/A;   DILATION AND CURETTAGE OF UTERUS     GASTROSTOMY TUBE PLACEMENT  10/13/2021   HERNIA REPAIR      Physical Exam    Triage Vital Signs: ED Triage Vitals  Encounter Vitals Group     BP 05/27/24 1839 (!) 158/91     Girls Systolic BP Percentile --      Girls Diastolic BP Percentile --      Boys Systolic BP Percentile --      Boys Diastolic BP Percentile --      Pulse Rate 05/27/24 1839 93     Resp 05/27/24 1839 20     Temp 05/27/24 1839 99.4 F (37.4 C)     Temp Source 05/27/24 2204 Oral     SpO2 05/27/24 1839 93 %     Weight 05/27/24 2204 232 lb (105.2 kg)     Height 05/27/24 2204 5' 4 (1.626 m)     Head Circumference --      Peak Flow --      Pain Score 05/27/24 1841 10     Pain Loc --      Pain Education --      Exclude from Growth Chart --     Most recent vital signs: Vitals:   05/27/24 1839 05/27/24 2204  BP: (!) 158/91 (!) 153/100  Pulse: 93 88  Resp: 20 18  Temp: 99.4 F (37.4 C) 98.5 F (36.9 C)  SpO2: 93% 97%    General: Awake, no distress.  CV:  Good peripheral  perfusion.  Regular rate rhythm Resp:  Normal effort.  Clear lungs Abd:  No distention.  Other:  Head atraumatic.  No lymphadenopathy.  Left SCM is tense and tender to touch reproducing her symptoms.  Pain is aggravated by stretching the left SCM as well.   ED Results / Procedures / Treatments   Labs (all labs ordered are listed, but only abnormal results are displayed) Labs Reviewed  BASIC METABOLIC PANEL WITH GFR - Abnormal; Notable for the following components:      Result Value   Glucose, Bld 121 (*)    All other components within normal limits  CBC WITH DIFFERENTIAL/PLATELET - Abnormal; Notable for the following components:   Hemoglobin 10.8 (*)    HCT 34.8 (*)    RDW 17.2 (*)    All other components within normal limits  TROPONIN I (HIGH SENSITIVITY)     EKG    RADIOLOGY    PROCEDURES:  Procedures   MEDICATIONS ORDERED IN ED: Medications  methocarbamol  (ROBAXIN ) tablet 500 mg (has no administration in time range)  naproxen  (NAPROSYN ) tablet 500 mg (has no administration in time  range)     IMPRESSION / MDM / ASSESSMENT AND PLAN / ED COURSE  I reviewed the triage vital signs and the nursing notes.  DDx: Muscle strain/spasm, electrolyte derangement.  Doubt intracranial hemorrhage, meningitis, brain mass, dissection, thrombosis  Patient's presentation is most consistent with acute presentation with potential threat to life or bodily function.  Patient presents with pain in the left neck on top of her chronic headache syndrome.  Clinically apparent left sternocleidomastoid spasm and musculoskeletal pain.  Start on Robaxin  and naproxen , follow-up PCP.       FINAL CLINICAL IMPRESSION(S) / ED DIAGNOSES   Final diagnoses:  Neck muscle strain, initial encounter     Rx / DC Orders   ED Discharge Orders          Ordered    methocarbamol  (ROBAXIN ) 500 MG tablet  4 times daily        05/27/24 2239    naproxen  (NAPROSYN ) 500 MG tablet  2 times daily with meals        05/27/24 2239             Note:  This document was prepared using Dragon voice recognition software and may include unintentional dictation errors.   Viviann Pastor, MD 05/27/24 2308

## 2024-06-06 ENCOUNTER — Telehealth: Payer: Self-pay

## 2024-06-06 ENCOUNTER — Ambulatory Visit (INDEPENDENT_AMBULATORY_CARE_PROVIDER_SITE_OTHER): Admitting: Family Medicine

## 2024-06-06 VITALS — BP 192/137 | HR 84 | Temp 98.5°F | Resp 18 | Ht 64.0 in | Wt 242.0 lb

## 2024-06-06 DIAGNOSIS — F419 Anxiety disorder, unspecified: Secondary | ICD-10-CM

## 2024-06-06 DIAGNOSIS — I10 Essential (primary) hypertension: Secondary | ICD-10-CM

## 2024-06-06 MED ORDER — CARVEDILOL 3.125 MG PO TABS: 3.1250 mg | ORAL_TABLET | Freq: Two times a day (BID) | ORAL | 3 refills | Status: DC

## 2024-06-06 NOTE — Assessment & Plan Note (Signed)
 Denies SI or HI.  GAD & score is 20.  Discussed seeing a therapist

## 2024-06-06 NOTE — Telephone Encounter (Signed)
 Spoke with patient and informed her that she should have refills on file with pharmacy and that she will need to check and see if medication can be refilled now.

## 2024-06-06 NOTE — Progress Notes (Signed)
 Established Patient Office Visit  Subjective   Patient ID: Lisa Hurst, female    DOB: 23-Aug-1971  Age: 53 y.o. MRN: [id number]  Chief Complaint  Patient presents with   Hospitalization Follow-up    HPI [name] 53 YO with hypertension, anxiety, GERD, migraine, chronic pain syndrome (s/p  resection of syrinx of the spinal cord and meningioma, s/p craniotomy for resection of the syrinx and posterior fossa mass).  She has a chronic pain syndrome of her left face, left neck left shoulder and left arm.    She added HCTZ 12.5 mg to her regimen of losartan 100 mg daily and metoprolol to tartrate 25 mg daily.  She checked her blood pressures at home and they remained in the 160s over 100s.  She fell backwards and hit her head and went to the emergency room.  She admits that she drinks very little water and she was having dizziness when she stood up.  She has CT imaging of the head and cervical spine stable meningioma along the left petrous ridge.  No acute abnormality of the cervical spine, mild straightening of the normal cervical lordosis which may be related to muscular spasm. She has taken her metoprolol to tartrate today and her losartan 100 mg but not the HCTZ. She has had MRIs of her neck and her brain.  She meets with one of the neurosurgeons tomorrow.  She has an appointment with neurology next week.  She has a chronic pain syndrome and nothing they have tried so far has helped.  She has pain in the left side takes, left shoulder.  She has difficulty sleeping because of the pain. No real improvement in her anxiety even though we increased her escitalopram  to 20 mg daily.  She says she just sits around all day and worries because she is in pain all the time.    Objective:     BP (!) 192/137   Pulse 84   Temp 98.5 F (36.9 C) (Oral)   Resp 18   Ht 5' 4 (1.626 m)   Wt 242 lb (109.8 kg)   LMP 02/15/2021 (Exact Date)   SpO2 90%   BMI 41.54 kg/m    Physical Exam Vitals and  nursing note reviewed.  Constitutional:      Appearance: Normal appearance.  HENT:     Head: Normocephalic and atraumatic.  Eyes:     Conjunctiva/sclera: Conjunctivae normal.  Cardiovascular:     Rate and Rhythm: Normal rate and regular rhythm.  Pulmonary:     Effort: Pulmonary effort is normal.     Breath sounds: Normal breath sounds.  Musculoskeletal:     Right lower leg: No edema.     Left lower leg: No edema.  Skin:    General: Skin is warm and dry.  Neurological:     Mental Status: She is alert and oriented to person, place, and time.  Psychiatric:        Mood and Affect: Mood normal.        Behavior: Behavior normal.        Thought Content: Thought content normal.        Judgment: Judgment normal.          No results found for any visits on 06/06/24.    The ASCVD Risk score (Arnett DK, et al., 2019) failed to calculate for the following reasons:   Cannot find a previous HDL lab   Cannot find a previous total cholesterol lab  Assessment & Plan:  Primary hypertension Assessment & Plan: Stop the metoprolol tartrate 25mg  daily and trial coreg  3.125mg  BID.  Please drink more water.  Continue losartan 100 mg a day and hydrochlorothiazide  12.5 mg daily.  Please continue to check blood pressure at home.  FOLLOW-UP in a month to recheck BP.    Orders: -     Carvedilol ; Take 1 tablet (3.125 mg total) by mouth 2 (two) times daily with a meal.  Dispense: 60 tablet; Refill: 3  Anxiety Assessment & Plan: Denies SI or HI.  GAD & score is 20.  Discussed seeing a therapist      Return in about 4 weeks (around 07/04/2024).    Meka Lewan K Ryka Beighley, MD

## 2024-06-06 NOTE — Telephone Encounter (Signed)
 Copied from CRM [id number]. Topic: Clinical - Medical Advice >> Jun 06, 2024  4:30 PM Tiffany S wrote: Reason for CRM: hydrochlorothiazide  (HYDRODIURIL ) 12.5 MG tablet [[id number]]  Patient accidentally threw away meds patient is requesting call back on what to do please follow up with patient

## 2024-06-06 NOTE — Assessment & Plan Note (Signed)
 Stop the metoprolol tartrate 25mg  daily and trial coreg  3.125mg  BID.  Please drink more water.  Continue losartan 100 mg a day and hydrochlorothiazide  12.5 mg daily.  Please continue to check blood pressure at home.  FOLLOW-UP in a month to recheck BP.

## 2024-06-20 ENCOUNTER — Ambulatory Visit: Payer: Self-pay | Admitting: *Deleted

## 2024-06-20 NOTE — Telephone Encounter (Signed)
 FYI Only or Action Required?: Action required by provider: update on patient condition.  Patient was last seen in primary care on 06/06/2024 by Ziglar, Susan K, MD.  Called Nurse Triage reporting Hypertension.  Symptoms began several weeks ago.  Interventions attempted: Prescription medications: Carvedilol , losartan, HCTZ.  Symptoms are: gradually worsening.  Triage Disposition: Go to ED Now (Notify PCP)  Patient/caregiver understands and will follow disposition?: Declined ED disposition- patient has appointment at pain clinic today that she state sshe can not miss- am appointment has been scheduled- and patient advised ED if she has any changes at all.  Reason for Disposition  [1] Systolic BP >= 160 OR Diastolic >= 100 AND [2] cardiac (e.g., breathing difficulty, chest pain) or neurologic symptoms (e.g., new-onset blurred or double vision, unsteady gait)  Answer Assessment - Initial Assessment Questions 1. BLOOD PRESSURE: What is your blood pressure? Did you take at least two measurements 5 minutes apart?     188/124, 174/111 P 93- Patient states it was this high at last visit and provider is aware 2. ONSET: When did you take your blood pressure?     today 3. HOW: How did you take your blood pressure? (e.g., automatic home BP monitor, visiting nurse)     Automatic cuff- arm 4. HISTORY: Do you have a history of high blood pressure?     yes 5. MEDICINES: Are you taking any medicines for blood pressure? Have you missed any doses recently?     Yes- no missed/changed medication, losartan and hydrochlorothiazide - 2 weeks, carvedilol   6. OTHER SYMPTOMS: Do you have any symptoms? (e.g., blurred vision, chest pain, difficulty breathing, headache, weakness)     Weakness, dizzy, headache, sometimes has chest pain  Protocols used: Blood Pressure - High-A-AH   Copied from CRM #[id number]. Topic: Clinical - Red Word Triage >> Jun 20, 2024 11:34 AM Nathanel BROCKS wrote: Red Word that  prompted transfer to Nurse Triage: 184/124 blood pressure. She is going to pain center. She says it is pain related.

## 2024-06-21 ENCOUNTER — Ambulatory Visit (INDEPENDENT_AMBULATORY_CARE_PROVIDER_SITE_OTHER): Admitting: Family Medicine

## 2024-06-21 ENCOUNTER — Encounter: Payer: Self-pay | Admitting: Family Medicine

## 2024-06-21 VITALS — BP 162/106 | HR 97 | Temp 98.0°F | Resp 18 | Ht 64.0 in | Wt 241.0 lb

## 2024-06-21 DIAGNOSIS — R7303 Prediabetes: Secondary | ICD-10-CM | POA: Insufficient documentation

## 2024-06-21 DIAGNOSIS — I10 Essential (primary) hypertension: Secondary | ICD-10-CM

## 2024-06-21 LAB — POCT GLYCOSYLATED HEMOGLOBIN (HGB A1C): Hemoglobin A1C: 6.3 % — AB (ref 4.0–5.6)

## 2024-06-21 MED ORDER — CARVEDILOL 6.25 MG PO TABS: 6.2500 mg | ORAL_TABLET | Freq: Two times a day (BID) | ORAL | 3 refills | Status: DC

## 2024-06-21 MED ORDER — METFORMIN HCL ER 500 MG PO TB24: 500.0000 mg | ORAL_TABLET | Freq: Every day | ORAL | 1 refills | Status: AC

## 2024-06-21 NOTE — Progress Notes (Signed)
 Established Patient Office Visit  Subjective   Patient ID: Lisa Hurst, female    DOB: [birth year]  Age: 53 y.o. 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  She has a chronic pain syndrome of her left face, left neck left shoulder and left arm.   We have been having trouble getting her blood pressure under control.  She was on metoprolol to tartrate 25, losartan 100 mg and HCTZ 12.5 mg daily.  We changed her metoprolol to tartrate to Coreg  3.125 mg and she has noticed that her blood pressures have been high at home in the 170s over the 120s.  She reports she is taking her blood pressure medicine today and she is at 173/112.  She is also complaining of chronic pain of her head neck and arms.  Particularly her left arm.  She has had MRIs of the spine and brain.  She is on amitriptyline 75 mg nightly, Tegretol 200 mg twice daily and Lyrica 200 mg 3 times daily. Interventional Pain management is going to ablate nerves with radiation in an attempt to relieve the pain in her left face, neck and left arm.  She is complaining of burning in her fingertips of both hands.  She has gained weight over the last year.  She has no family history of diabetes that she is aware of.  She has never had an A1c.    Objective:     BP (!) 162/106 (BP Location: Left Arm, Patient Position: Sitting, Cuff Size: Large)   Pulse 97   Temp 98 F (36.7 C) (Oral)   Resp 18   Ht 5' 4 (1.626 m)   Wt 241 lb (109.3 kg)   LMP 02/15/2021 (Exact Date)   SpO2 93%   BMI 41.37 kg/m    Physical Exam Vitals and nursing note reviewed.  Constitutional:      Appearance: Normal appearance.  HENT:     Head: Normocephalic and atraumatic.  Eyes:      Conjunctiva/sclera: Conjunctivae normal.  Cardiovascular:     Rate and Rhythm: Normal rate and regular rhythm.  Pulmonary:     Effort: Pulmonary effort is normal.     Breath sounds: Normal breath sounds.  Musculoskeletal:     Right lower leg: No edema.     Left lower leg: No edema.  Skin:    General: Skin is warm and dry.  Neurological:     Mental Status: She is alert and oriented to person, place, and time.  Psychiatric:        Mood and Affect: Mood normal.        Behavior: Behavior normal.        Thought Content: Thought content normal.        Judgment: Judgment normal.          Results for orders placed or performed in visit on 06/21/24  POCT glycosylated hemoglobin (Hb A1C)  Result Value Ref Range   Hemoglobin A1C 6.3 (A) 4.0 - 5.6 %   HbA1c POC (<> result, manual entry)     HbA1c, POC (prediabetic range)     HbA1c, POC (controlled diabetic range)        The ASCVD Risk score (Arnett DK, et al., 2019)  failed to calculate for the following reasons:   Cannot find a previous HDL lab   Cannot find a previous total cholesterol lab    Assessment & Plan:    Primary hypertension Assessment & Plan: Continue Losartan 100 mg and Continue the and HCTZ 12.5.mg daily  Increasing Coreg  to 6.25 mg twice daily.  You can take 2 of the 3.125 twice a day since you have plenty of these.  Follow-up in a month.  Orders: -     Carvedilol ; Take 1 tablet (6.25 mg total) by mouth 2 (two) times daily with a meal.  Dispense: 60 tablet; Refill: 3 -     POCT glycosylated hemoglobin (Hb A1C)  Borderline diabetes Assessment & Plan: A1c is 6.3% today.  Might explain why she is having burning in her fingertips.  Discussed modifying her diet and she would like a medication.  Metformin  500 mg XR taken once a day with a meal.  Follow-up in a month.  Orders: -     metFORMIN  HCl ER; Take 1 tablet (500 mg total) by mouth daily with breakfast.  Dispense: 90 tablet; Refill: 1    Return in about 4  weeks (around 07/19/2024).    Annalyce Lanpher K Elynor Kallenberger, MD

## 2024-06-21 NOTE — Assessment & Plan Note (Signed)
 Continue Losartan 100 mg and Continue the and HCTZ 12.5.mg daily  Increasing Coreg  to 6.25 mg twice daily.  You can take 2 of the 3.125 twice a day since you have plenty of these.  Follow-up in a month.

## 2024-06-21 NOTE — Assessment & Plan Note (Signed)
 A1c is 6.3% today.  Might explain why she is having burning in her fingertips.  Discussed modifying her diet and she would like a medication.  Metformin  500 mg XR taken once a day with a meal.  Follow-up in a month.

## 2024-07-12 ENCOUNTER — Ambulatory Visit: Admitting: Family Medicine

## 2024-07-21 ENCOUNTER — Encounter: Payer: Self-pay | Admitting: Family Medicine

## 2024-07-21 ENCOUNTER — Ambulatory Visit: Admitting: Family Medicine

## 2024-07-21 VITALS — BP 161/105 | HR 101 | Temp 97.8°F | Resp 18 | Ht 64.0 in | Wt 240.0 lb

## 2024-07-21 DIAGNOSIS — R7303 Prediabetes: Secondary | ICD-10-CM | POA: Diagnosis not present

## 2024-07-21 DIAGNOSIS — I10 Essential (primary) hypertension: Secondary | ICD-10-CM | POA: Diagnosis not present

## 2024-07-21 DIAGNOSIS — Z23 Encounter for immunization: Secondary | ICD-10-CM | POA: Diagnosis not present

## 2024-07-21 NOTE — Assessment & Plan Note (Signed)
 A1c is 6.3%.  Has started metformin  XR 500 mg daily.  Has not been checking her blood sugars.  Burning in her fingertips is still present.  Continue on metformin  XR 500 mg daily.

## 2024-07-21 NOTE — Assessment & Plan Note (Signed)
 Blood pressure is running in the 150s over 100s.  She has got an occasional reading in the 120s over 80s at home.  Continue carvedilol  6.25 mg twice daily, losartan 100 mg and HCTZ 12.5 mg daily.  Having orthostatic hypotension and falling frequently.  Please stand up and be still for a minute before you begin to walk.

## 2024-07-21 NOTE — Progress Notes (Signed)
 Established Patient Office Visit  Subjective   Patient ID: Lisa Hurst, female    DOB: [birth year]  Age: 53 y.o. MRN: [id number]  Chief Complaint  Patient presents with   Medical Management of Chronic Issues    HPI [name] 53 year old with HTN, anxiety, GERD, migraines, chronic pain syndrome (s/p resection of the syrinx of the spinal cord and meningioma, s/p craniotomy for resection of the syrinx and posterior fossa mass) chronic pain syndrome of the left face, left neck, left shoulder and left arm.  Discussed the use of AI scribe software for clinical note transcription with the patient, who gave verbal consent to proceed.  History of Present Illness   Lisa Hurst is a 53 year old female with hypertension who presents with frequent falls and dizziness.  She experiences frequent falls, particularly when getting up to go to the bathroom or entering her house. Dizziness and falls occur as soon as she reaches the bathroom door or when stepping into her house. These episodes happen after sitting for a while and standing up too quickly. The falls are painful and irritating, impacting her daily activities and mobility.  She is currently on losartan 100 mg, HCTZ 12.5 mg, and Coreg  6.25 mg twice a day for hypertension. Her blood pressure readings at home range from 150s to 170s over 100, with a recent reading of 127/80 while resting. No side effects from her blood pressure medication, but she acknowledges the need to slow down her movements to prevent falls.  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 She has completed the preparation for radiation, including the creation of a face mask, and hopes the treatment will alleviate symptoms such as pain behind her ear.  Her A1c is 6.3%. She finds the metformin  XR pill to be large but reports no gastrointestinal side effects. She experiences  burning in her fingertips, which she attributes to her diabetes rather than her neck issues, as the burning is bilateral.  She is taking Flexeril  (cyclobenzaprine ) as needed for pain, up to three times a day, but questions its effectiveness. She notes that she has been using it more frequently due to increased pain.         ROS    Objective:     BP (!) 161/105 (BP Location: Left Arm, Patient Position: Sitting, Cuff Size: Normal)   Pulse (!) 101   Temp 97.8 F (36.6 C) (Oral)   Resp 18   Ht 5' 4 (1.626 m)   Wt 240 lb (108.9 kg)   LMP 02/15/2021 (Exact Date)   SpO2 95%   BMI 41.20 kg/m    Physical Exam Vitals and nursing note reviewed.  Constitutional:      Appearance: Normal appearance.  HENT:     Head: Normocephalic and atraumatic.  Eyes:     Conjunctiva/sclera: Conjunctivae normal.  Cardiovascular:     Rate and Rhythm: Normal rate and regular rhythm.  Pulmonary:     Effort: Pulmonary effort is normal.     Breath sounds: Normal breath sounds.  Musculoskeletal:     Right lower leg: No edema.     Left lower leg: No edema.  Skin:    General: Skin is warm and dry.  Neurological:     Mental Status: She is alert and oriented to person, place, and time.  Psychiatric:        Mood and Affect:  Mood normal.        Behavior: Behavior normal.        Thought Content: Thought content normal.        Judgment: Judgment normal.          No results found for any visits on 07/21/24.    The ASCVD Risk score (Arnett DK, et al., 2019) failed to calculate for the following reasons:   Cannot find a previous HDL lab   Cannot find a previous total cholesterol lab    Assessment & Plan:  Immunization due -     Flu vaccine trivalent PF, 6mos and older(Flulaval,Afluria,Fluarix,Fluzone)  Borderline diabetes Assessment & Plan: A1c is 6.3%.  Has started metformin  XR 500 mg daily.  Has not been checking her blood sugars.  Burning in her fingertips is still present.  Continue  on metformin  XR 500 mg daily.   Primary hypertension Assessment & Plan: Blood pressure is running in the 150s over 100s.  She has got an occasional reading in the 120s over 80s at home.  Continue carvedilol  6.25 mg twice daily, losartan 100 mg and HCTZ 12.5 mg daily.  Having orthostatic hypotension and falling frequently.  Please stand up and be still for a minute before you begin to walk.      Return in about 4 weeks (around 08/18/2024).    Jaidin Ugarte K Jakhiya Brower, MD

## 2024-08-08 ENCOUNTER — Ambulatory Visit: Admitting: Student in an Organized Health Care Education/Training Program

## 2024-09-01 ENCOUNTER — Other Ambulatory Visit: Payer: Self-pay | Admitting: Family Medicine

## 2024-09-01 DIAGNOSIS — I10 Essential (primary) hypertension: Secondary | ICD-10-CM

## 2024-09-04 ENCOUNTER — Telehealth: Payer: Self-pay | Admitting: Family Medicine

## 2024-09-04 DIAGNOSIS — D320 Benign neoplasm of cerebral meninges: Secondary | ICD-10-CM | POA: Diagnosis not present

## 2024-09-04 DIAGNOSIS — I10 Essential (primary) hypertension: Secondary | ICD-10-CM

## 2024-09-04 NOTE — Telephone Encounter (Unsigned)
 Copied from CRM #[id number]. Topic: Clinical - Medication Refill >> Sep 04, 2024  3:08 PM Zebedee SAUNDERS wrote: Medication: HCTZ  Has the patient contacted their pharmacy? Yes (Agent: If no, request that the patient contact the pharmacy for the refill. If patient does not wish to contact the pharmacy document the reason why and proceed with request.) (Agent: If yes, when and what did the pharmacy advise?)  This is the patient's preferred pharmacy:  [id number] [address] [place] - [address] [place] [address] Phone: [phone] Fax: [phone]  Is this the correct pharmacy for this prescription? Yes If no, delete pharmacy and type the correct one.   Has the prescription been filled recently? Yes  Is the patient out of the medication? Yes  Has the patient been seen for an appointment in the last year OR does the patient have an upcoming appointment? Yes  Can we respond through MyChart? Yes  Agent: Please be advised that Rx refills may take up to 3 business days. We ask that you follow-up with your pharmacy.

## 2024-09-05 DIAGNOSIS — D320 Benign neoplasm of cerebral meninges: Secondary | ICD-10-CM | POA: Diagnosis not present

## 2024-09-05 MED ORDER — HYDROCHLOROTHIAZIDE 12.5 MG PO TABS: 12.5000 mg | ORAL_TABLET | Freq: Every day | ORAL | 3 refills | Status: AC

## 2024-09-06 DIAGNOSIS — R202 Paresthesia of skin: Secondary | ICD-10-CM | POA: Diagnosis not present

## 2024-09-06 DIAGNOSIS — D320 Benign neoplasm of cerebral meninges: Secondary | ICD-10-CM | POA: Diagnosis not present

## 2024-09-06 DIAGNOSIS — F39 Unspecified mood [affective] disorder: Secondary | ICD-10-CM | POA: Diagnosis not present

## 2024-09-06 DIAGNOSIS — D329 Benign neoplasm of meninges, unspecified: Secondary | ICD-10-CM | POA: Diagnosis not present

## 2024-09-06 DIAGNOSIS — R200 Anesthesia of skin: Secondary | ICD-10-CM | POA: Diagnosis not present

## 2024-09-06 DIAGNOSIS — G950 Syringomyelia and syringobulbia: Secondary | ICD-10-CM | POA: Diagnosis not present

## 2024-09-06 DIAGNOSIS — G890 Central pain syndrome: Secondary | ICD-10-CM | POA: Diagnosis not present

## 2024-09-06 DIAGNOSIS — M5481 Occipital neuralgia: Secondary | ICD-10-CM | POA: Diagnosis not present

## 2024-09-06 DIAGNOSIS — R519 Headache, unspecified: Secondary | ICD-10-CM | POA: Diagnosis not present

## 2024-09-07 DIAGNOSIS — D320 Benign neoplasm of cerebral meninges: Secondary | ICD-10-CM | POA: Diagnosis not present

## 2024-09-08 DIAGNOSIS — D320 Benign neoplasm of cerebral meninges: Secondary | ICD-10-CM | POA: Diagnosis not present

## 2024-09-14 DIAGNOSIS — D320 Benign neoplasm of cerebral meninges: Secondary | ICD-10-CM | POA: Diagnosis not present

## 2024-09-19 ENCOUNTER — Emergency Department
Admission: EM | Admit: 2024-09-19 | Discharge: 2024-09-19 | Disposition: A | Discharge status: Home / Self Care | Attending: Emergency Medicine | Admitting: Emergency Medicine

## 2024-09-19 ENCOUNTER — Other Ambulatory Visit: Payer: Self-pay

## 2024-09-19 ENCOUNTER — Encounter: Payer: Self-pay | Admitting: Emergency Medicine

## 2024-09-19 DIAGNOSIS — M542 Cervicalgia: Secondary | ICD-10-CM | POA: Diagnosis present

## 2024-09-19 DIAGNOSIS — I10 Essential (primary) hypertension: Secondary | ICD-10-CM | POA: Diagnosis not present

## 2024-09-19 DIAGNOSIS — M7918 Myalgia, other site: Secondary | ICD-10-CM | POA: Insufficient documentation

## 2024-09-19 DIAGNOSIS — M791 Myalgia, unspecified site: Secondary | ICD-10-CM

## 2024-09-19 LAB — CBC WITH DIFFERENTIAL/PLATELET
Abs Immature Granulocytes: 0.05 K/uL (ref 0.00–0.07)
Basophils Absolute: 0 K/uL (ref 0.0–0.1)
Basophils Relative: 0 %
Eosinophils Absolute: 0.1 K/uL (ref 0.0–0.5)
Eosinophils Relative: 1 %
HCT: 35.8 % — ABNORMAL LOW (ref 36.0–46.0)
Hemoglobin: 11.4 g/dL — ABNORMAL LOW (ref 12.0–15.0)
Immature Granulocytes: 1 %
Lymphocytes Relative: 18 %
Lymphs Abs: 1.4 K/uL (ref 0.7–4.0)
MCH: 26.7 pg (ref 26.0–34.0)
MCHC: 31.8 g/dL (ref 30.0–36.0)
MCV: 83.8 fL (ref 80.0–100.0)
Monocytes Absolute: 0.5 K/uL (ref 0.1–1.0)
Monocytes Relative: 6 %
Neutro Abs: 5.6 K/uL (ref 1.7–7.7)
Neutrophils Relative %: 74 %
Platelets: 326 K/uL (ref 150–400)
RBC: 4.27 MIL/uL (ref 3.87–5.11)
RDW: 16.2 % — ABNORMAL HIGH (ref 11.5–15.5)
WBC: 7.5 K/uL (ref 4.0–10.5)
nRBC: 0 % (ref 0.0–0.2)

## 2024-09-19 LAB — COMPREHENSIVE METABOLIC PANEL WITH GFR
ALT: 29 U/L (ref 0–44)
AST: 22 U/L (ref 15–41)
Albumin: 4.3 g/dL (ref 3.5–5.0)
Alkaline Phosphatase: 98 U/L (ref 38–126)
Anion gap: 11 (ref 5–15)
BUN: 15 mg/dL (ref 6–20)
CO2: 30 mmol/L (ref 22–32)
Calcium: 9.3 mg/dL (ref 8.9–10.3)
Chloride: 97 mmol/L — ABNORMAL LOW (ref 98–111)
Creatinine, Ser: 0.78 mg/dL (ref 0.44–1.00)
GFR, Estimated: >60 mL/min (ref >60)
Glucose, Bld: 96 mg/dL (ref 70–99)
Potassium: 4.3 mmol/L (ref 3.5–5.1)
Sodium: 138 mmol/L (ref 135–145)
Total Bilirubin: 0.2 mg/dL (ref 0.0–1.2)
Total Protein: 7.3 g/dL (ref 6.5–8.1)

## 2024-09-19 LAB — RESP PANEL BY RT-PCR (RSV, FLU A&B, COVID)  RVPGX2
Influenza A by PCR: NEGATIVE
Influenza B by PCR: NEGATIVE
Resp Syncytial Virus by PCR: NEGATIVE
SARS Coronavirus 2 by RT PCR: NEGATIVE

## 2024-09-19 MED ORDER — KETOROLAC TROMETHAMINE 15 MG/ML IJ SOLN
15.0000 mg | Freq: Once | INTRAMUSCULAR | Status: AC
  Administered 2024-09-19: 18:00:00 15 mg via INTRAVENOUS
  Filled 2024-09-19: qty 1

## 2024-09-19 MED ORDER — SODIUM CHLORIDE 0.9 % IV BOLUS
1000.0000 mL | Freq: Once | INTRAVENOUS | Status: AC
  Administered 2024-09-19: 18:00:00 1000 mL via INTRAVENOUS

## 2024-09-19 NOTE — ED Triage Notes (Signed)
 Pt to ED via POV. 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 Pt states that she is having increased pain all over her body and a Migraine.

## 2024-09-19 NOTE — ED Provider Notes (Signed)
 [facility] Provider Note    Event Date/Time   First MD Initiated Contact with Patient 09/19/24 1508     (approximate)   History   Chief Complaint: Generalized Body Aches and Migraine   HPI  Lisa Hurst is a 53 y.o. female who comes ED complaining of her usual left-sided head and neck pain, worse after radiation treatment yesterday, and also associated with whole body pain which has been ongoing for the past 4 days.    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 Pain is constant and exacerbated by radiation therapy. Current medications include pregabalin and methadone, which are managing but not relieving the pain. Lidocaine  patches provide temporary relief. Radiation therapy is ongoing to halt tumor growth, but not expected to decrease tumor size or pain. Referral to neurologist and pain clinic for further management. Multimodal approach with different pain medications is planned to address pain pathways. - Increased meloxicam to 15 mg once daily in the morning. - Added Tylenol  1000 mg every six hours for two weeks, then reduce to three times a day. - Continue methadone 5 mg three times a day. - Continue pregabalin 200 mg three times a day. - Scheduled follow-up in one month to assess pain management efficacy.  Opioid therapy monitoring Opioid therapy with methadone is part of the pain management plan. Current dose is 5 mg three times a day, which is considered a good dose. - Continue methadone 5 mg three times a day.  As there was evidence of pain relief, the patient will continue methadone at the present dose. Prescription was renewed with 0 refills. Potential side effects including possible no side effects reviewed with the patient. Dose escalation instructions were reviewed with the patient and  questions were answered.     Past Medical History:  Diagnosis Date   Allergy 7/17   Anemia    Anxiety    Depression    Heart murmur 07/17   Hypertension    Neuromuscular disorder (HCC) 01/23   L facial paralysis since brain surgery   Occipital neuralgia    since 2022    Current Outpatient Rx   Order #: [id number] Class: Historical Med   Order #: [id number] Class: Normal   Order #: [id number] Class: Normal   Order #: [id number] Class: Normal   Order #: [id number] Class: Normal   Order #: [id number] Class: Normal   Order #: [id number] Class: Historical Med   Order #: [id number] Class: Normal   Order #: [id number] Class: Normal   Order #: [id number] Class: Normal   Order #: [id number] Class: Historical Med   Order #: [id number] Class: Normal   Order #: [id number] Class: Historical Med   Order #: [id number] Class: Normal   Order #: [id number] Class: Historical Med    Past Surgical History:  Procedure Laterality Date   BRAIN SURGERY  2022   brain tumor removed   CESAREAN SECTION  03/17/10   COLONOSCOPY WITH PROPOFOL  N/A 01/06/2021   Procedure: COLONOSCOPY WITH PROPOFOL ;  Surgeon: Therisa Bi, MD;  Location: [facility] ENDOSCOPY;  Service: Gastroenterology;  Laterality: N/A;   DILATION AND CURETTAGE OF UTERUS     GASTROSTOMY TUBE PLACEMENT  10/13/2021   HERNIA REPAIR      Physical Exam   Triage Vital Signs: ED Triage Vitals [09/19/24 1424]  Encounter Vitals Group     BP (!) 128/90     Girls Systolic BP Percentile  Girls Diastolic BP Percentile      Boys Systolic BP Percentile      Boys Diastolic BP Percentile      Pulse Rate 90     Resp 16     Temp 98.4 F (36.9 C)     Temp Source Oral     SpO2 98 %     Weight 240 lb (108.9 kg)     Height 5' 4 (1.626 m)     Head Circumference      Peak Flow      Pain Score 10     Pain Loc      Pain Education      Exclude from Growth Chart     Most recent vital signs: Vitals:   09/19/24 1424 09/19/24 1926  BP: (!) 128/90 134/71  Pulse: 90 81   Resp: 16 16  Temp: 98.4 F (36.9 C) 98.1 F (36.7 C)  SpO2: 98% 98%    General: Awake, no distress.  CV:  Good peripheral perfusion.  Regular rate rhythm Resp:  Normal effort.  Clear lungs Abd:  No distention.  Soft nontender Other:  No lower extremity edema dry oral mucosa   ED Results / Procedures / Treatments   Labs (all labs ordered are listed, but only abnormal results are displayed) Labs Reviewed  CBC WITH DIFFERENTIAL/PLATELET - Abnormal; Notable for the following components:      Result Value   Hemoglobin 11.4 (*)    HCT 35.8 (*)    RDW 16.2 (*)    All other components within normal limits  COMPREHENSIVE METABOLIC PANEL WITH GFR - Abnormal; Notable for the following components:   Chloride 97 (*)    All other components within normal limits  RESP PANEL BY RT-PCR (RSV, FLU A&B, COVID)  [id number]     EKG    RADIOLOGY    PROCEDURES:  Procedures   MEDICATIONS ORDERED IN ED: Medications  sodium chloride  0.9 % bolus 1,000 mL (1,000 mLs Intravenous New Bag/Given 09/19/24 1733)  ketorolac  (TORADOL ) 15 MG/ML injection 15 mg (15 mg Intravenous Given 09/19/24 1732)     IMPRESSION / MDM / ASSESSMENT AND PLAN / ED COURSE  I reviewed the triage vital signs and the nursing notes.  DDx: COVID, influenza, dehydration, electrolyte derangement, anemia  Patient's presentation is most consistent with acute presentation with potential threat to life or bodily function.  Patient presents with generalized bodyaches and exacerbation of her chronic pain syndrome after radiation therapy yesterday.  Also complains of fatigue, nausea.  Suspect influenza-like illness.  Will check labs, provide supportive care   ----------------------------------------- 8:34 PM on 09/19/2024 ----------------------------------------- Feeling much better.  Labs normal.  Stable for discharge      FINAL CLINICAL IMPRESSION(S) / ED DIAGNOSES   Final diagnoses:  Myalgia     Rx / DC  Orders   ED Discharge Orders     None        Note:  This document was prepared using Dragon voice recognition software and may include unintentional dictation errors.   Viviann Pastor, MD 09/19/24 2034

## 2024-09-19 NOTE — ED Notes (Signed)
 IV start attempted twice by this RN, unsuccessful. IV consult order placed.

## 2024-09-22 ENCOUNTER — Ambulatory Visit: Payer: Self-pay

## 2024-09-22 NOTE — Telephone Encounter (Signed)
 FYI Only or Action Required?: FYI only for provider: appointment scheduled on 10/04/24. No sooner appts available.   Patient was last seen in primary care on 07/21/2024 by Ziglar, Susan K, MD.  Called Nurse Triage reporting Muscle Pain.  Symptoms began several days ago.  Interventions attempted: Prescription medications: Methadone, Pregabalin, Meloxicam.  Symptoms are: unchanged.  Triage Disposition: See PCP Within 2 Weeks (overriding See HCP Within 4 Hours (Or PCP Triage))  Patient/caregiver understands and will follow disposition?: Yes  Reason for Disposition  [1] SEVERE pain (e.g., excruciating, unable to do any normal activities) AND [2] not improved 2 hours after pain medicine  Answer Assessment - Initial Assessment Questions Patient has chronic pain condition, but started to feel generalized muscle soreness about 6 days ago. Patient went to the ED on 09/19/24 for this pain, but is calling in today because she is still in 9/10 pain. She takes Methadone, Pregabalin, and Meloxicam for pain, but states that these only bring her to a 9. Advised to come in for office visit. Scheduled appt 10/04/24, no sooner appts available with preferred office. Advised to call back or visit UC if symptoms worsen.   1. ONSET: When did the muscle aches or body pains start?      About 6 days ago  2. LOCATION: What part of your body is hurting? (e.g., entire body, arms, legs)      Generalized muscle soreness  3. SEVERITY: How bad is the pain? (Scale 1-10; or mild, moderate, severe)     9/10  4. CAUSE: What do you think is causing the pains?     Chronic pain  5. FEVER: Do you have a fever? If Yes, ask: What is your temperature, how was it measured, and  when did it start?      No fever  6. OTHER SYMPTOMS: Do you have any other symptoms? (e.g., chest pain, cold or flu symptoms, rash, weakness, weight loss)     Denies chest pain and any other symptoms  7. PREGNANCY: Is there any  chance you are pregnant? When was your last menstrual period?     Unknown  8. TRAVEL: Have you traveled out of the country in the last month? (e.g., exposures, travel history)     No  Protocols used: Muscle Aches and Body Pain-A-AH  Copied from CRM #[id number]. Topic: Clinical - Red Word Triage >> Sep 22, 2024 12:34 PM Lonell PEDLAR wrote: Red Word that prompted transfer to Nurse Triage: Patient was discharged from the hospital on 12/17 and is still experiencing pain   ----------------------------------------------------------------------- From previous Reason for Contact - Scheduling: Patient/patient representative is calling to schedule an appointment. Refer to attachments for appointment information.

## 2024-09-28 ENCOUNTER — Other Ambulatory Visit: Payer: Self-pay | Admitting: Family Medicine

## 2024-09-28 DIAGNOSIS — F419 Anxiety disorder, unspecified: Secondary | ICD-10-CM

## 2024-10-03 ENCOUNTER — Ambulatory Visit: Admitting: Student in an Organized Health Care Education/Training Program

## 2024-10-04 ENCOUNTER — Ambulatory Visit: Admitting: Family Medicine

## 2024-10-16 ENCOUNTER — Other Ambulatory Visit: Payer: Self-pay | Admitting: Family Medicine

## 2024-10-16 DIAGNOSIS — I10 Essential (primary) hypertension: Secondary | ICD-10-CM

## 2024-10-17 ENCOUNTER — Encounter: Payer: Self-pay | Admitting: Student in an Organized Health Care Education/Training Program

## 2024-10-17 ENCOUNTER — Ambulatory Visit
Attending: Student in an Organized Health Care Education/Training Program | Admitting: Student in an Organized Health Care Education/Training Program

## 2024-10-17 VITALS — BP 130/79 | HR 97 | Temp 98.1°F | Resp 16 | Ht 64.0 in | Wt 240.0 lb

## 2024-10-17 DIAGNOSIS — G894 Chronic pain syndrome: Secondary | ICD-10-CM | POA: Diagnosis present

## 2024-10-17 DIAGNOSIS — Z86018 Personal history of other benign neoplasm: Secondary | ICD-10-CM | POA: Diagnosis present

## 2024-10-17 DIAGNOSIS — G540 Brachial plexus disorders: Secondary | ICD-10-CM | POA: Insufficient documentation

## 2024-10-17 DIAGNOSIS — S46812S Strain of other muscles, fascia and tendons at shoulder and upper arm level, left arm, sequela: Secondary | ICD-10-CM | POA: Insufficient documentation

## 2024-10-17 DIAGNOSIS — M792 Neuralgia and neuritis, unspecified: Secondary | ICD-10-CM | POA: Insufficient documentation

## 2024-10-17 DIAGNOSIS — Z9889 Other specified postprocedural states: Secondary | ICD-10-CM | POA: Diagnosis present

## 2024-10-17 NOTE — Progress Notes (Signed)
 PROVIDER NOTE: Interpretation of information contained herein should be left to medically-trained personnel. Specific patient instructions are provided elsewhere under Patient Instructions section of medical record. This document was created in part using AI and STT-dictation technology, any transcriptional errors that may result from this process are unintentional.  Patient: Lisa Hurst  Service: E/M Encounter  Provider: Wallie Sherry, MD  DOB: 21-Sep-1971  Delivery: Face-to-face  Specialty: Interventional Pain Management  MRN: [id number]  Setting: Ambulatory outpatient facility  Specialty designation: 09  Type: New Patient  Location: Outpatient office facility  PCP: Ziglar, Susan K, MD  DOS: 10/17/2024    Referring Prov.: Ziglar, Susan K, MD   Primary Reason(s) for Visit: Encounter for initial evaluation of one or more chronic problems (new to examiner) potentially causing chronic pain, and posing a threat to normal musculoskeletal function. (Level of risk: High) CC: Neck Pain (Left sided /pain from left side her head (from behind the left ear upwards the top of the head down to left neck and shoulder). /)  HPI  Lisa Hurst is a 54 y.o. year old, female patient, who comes for the first time to our practice referred by Ziglar, Susan K, MD for our initial evaluation of her chronic pain. She has Hypertension; Heart palpitations; SOB (shortness of breath) on exertion; Anxiety; Chronic pain syndrome; Knee pain; Endotracheally intubated; Hypomagnesemia; Menopausal symptoms; Postoperative anemia; S/P resection of meningioma; and Borderline diabetes on their problem list. Today she comes in for evaluation of her Neck Pain (Left sided /pain from left side her head (from behind the left ear upwards the top of the head down to left neck and shoulder). /)  Pain Assessment: Location: Left Neck Radiating: pain from left side her head (from behind the left ear upwards the top of the head down to left neck and  shoulder). Onset: More than a month ago Duration: Chronic pain Quality: Aching, Burning, Constant, Throbbing, Discomfort Severity: 10-Worst pain ever/10 (subjective, self-reported pain score)  Effect on ADL: Limits ADLs. Pain casuses fall due to dizziness. Skin very senstive to touch Timing: Constant Modifying factors: Denies BP: 130/79  HR: 97  Onset and Duration: Date of onset: 10/10/2021 Cause of pain: Surgery Severity: NAS-11 at its worse: 10/10, NAS-11 at its best: 9/10, NAS-11 now: 9/10, and NAS-11 on the average: 10/10 Timing: Morning, Afternoon, Night, During activity or exercise, and After activity or exercise Aggravating Factors: Bending, Kneeling, Lifiting, Motion, Nerve blocks, Prolonged sitting, Prolonged standing, Squatting, Twisting, Walking, Walking uphill, and Walking downhill Alleviating Factors: Denies Associated Problems: Constipation, Dizziness, Fatigue, Sweating, Swelling, Temperature changes, Weakness, Pain that wakes patient up, and Pain that does not allow patient to sleep Quality of Pain: Aching, Agonizing, Burning, Constant, Cruel, Exhausting, Horrible, Hot, Throbbing, and Uncomfortable Previous Examinations or Tests: CT scan, MRI scan, Nerve block, Neurological evaluation, and Neurosurgical evaluation Previous Treatments: Steroid treatments by mouth, TENS, and Trigger point injections  Lisa Hurst is being evaluated for possible interventional pain management therapies for the treatment of her chronic pain.   Discussed the use of AI scribe software for clinical note transcription with the patient, who gave verbal consent to proceed.  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 She was referred by the [facility] for further evaluation of her chronic pain management.  She underwent surgery for a Chiari malformation on October 10, 2021, at [facility],  where approximately 75% of the malformation was removed. Some remains near her brainstem. Post-surgery, she has experienced persistent left-sided neck pain overlying her sternocleidomastoid and radiating posteriorly to her trapezius with associated left-sided paralysis.  Her pain is described as a 'match behind my ear' with a constant burning sensation, deep and localized from behind her ear to her neck. She completed six weeks of radiation therapy shortly after the surgery; her symptoms have persisted.  She has tried various pain management strategies, including occipital nerve blocks on the left side, performed three to four times without relief, and Botox injections, the last of which was in December 2024. Medications such as methadone and oxycodone  are currently being used, with methadone providing more relief than oxycodone . She is also on Lyrica 100 mg twice daily, Flexeril , and amitriptyline.  She experiences left-sided weakness, particularly in her arm, and a tremor that developed post-surgery, which she attributes to medication use. There is also sensitivity in the area behind her ear, exacerbated by touch. No facial pain, but she notes some changes in vision and taste post-radiation.  Her social history includes living with her 72 year old son, who assists her at home. 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       Meds  Current Medications[1]  Imaging Review  Cervical Imaging: Cervical MR wo contrast: Results for orders placed during the hospital encounter of 05/20/21  MR CERVICAL SPINE WO CONTRAST  Narrative CLINICAL DATA:  Left-sided neck pain  EXAM: MRI CERVICAL SPINE WITHOUT CONTRAST  TECHNIQUE: Multiplanar, multisequence MR imaging of the cervical spine was performed. No intravenous contrast was administered.  COMPARISON:   None.  FINDINGS: Alignment: Straightening of the normal cervical lordosis, with mild reversal in the mid cervical spine.  Vertebrae: No fracture, evidence of discitis, or bone lesion.  Cord: Heterogeneously T2 hyperintense, expansile, intramedullary lesion extending from the craniocervical junction inferiorly, off the inferior field of view. The lesion either anteriorly displaces the central canal or originates from the central canal, insufficiently evaluated in the absence of intravenous contrast. The lesion contains several definitively cystic spaces, as well as T2 hypointense material more inferiorly, which may represent flow void and/or hemorrhage.  Posterior Fossa, vertebral arteries, paraspinal tissues: 5 mm extension of the cerebellar tonsils through the foramen magnum. Normal vertebral arteries and paraspinous tissues.  Disc levels:  Small central disc bulge at C6-C7, which indents the ventral spinal cord, given cord expansion. No other significant disc bulge. No neural foraminal narrowing.  IMPRESSION: Intramedullary, expansile spinal cord lesion with cystic spaces, extending from the craniocervical junction off the inferior field of view, incompletely evaluated in the absence of intravenous contrast. This could represent an ependymoma or syrinx, given 5 mm cerebellar ectopia. A glial tumor is less likely but also in the differential. Recommend MRI of the cervical and thoracic spine with and without contrast.  These results will be called to the ordering clinician or representative by the Radiologist Assistant, and communication documented in the PACS or [employer].   Electronically Signed By: Donald Campion M.D. On: 05/21/2021 19:28   MR CERVICAL SPINE W WO CONTRAST  Narrative CLINICAL DATA:  Prior meningioma resection, pain  EXAM: MRI CERVICAL SPINE WITHOUT AND WITH CONTRAST; MRI of the thoracic spine without and with intravenous  contrast  TECHNIQUE: Multiplanar and multiecho pulse sequences of the cervical spine, to include the craniocervical junction and cervicothoracic junction, were obtained without  and with intravenous contrast. Multiplanar multi weighted MRI of the thoracic spine without and with intravenous contrast  CONTRAST:  10mL GADAVIST  GADOBUTROL  1 MMOL/ML IV SOLN  COMPARISON:  MRI of the cervical spine dated 06/11/2021  FINDINGS: Alignment: Physiologic.  Vertebrae: Vertebral bodies demonstrate normal signal intensity. No acute fracture is identified.  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 Cystic dilatation of the cord at the level of C6 measures up to 0.4 cm in greatest axial dimension, previously up to 0.9 cm.  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 See dedicated MRI of the brain.  Disc levels:  C2-C3: The disk is normal in configuration. No facet arthropathy. No uncovertebral joint disease. No neuroforaminal stenosis. No spinal canal stenosis.  C3-C4: The disk is normal in configuration. Mild left facet arthropathy. Mild bilateral uncovertebral joint disease. Mild left neuroforaminal stenosis. No spinal canal stenosis.  C4-C5: Disc osteophyte complex. No facet arthropathy. No uncovertebral joint disease. No neuroforaminal stenosis. No spinal canal stenosis.  C5-C6: The disk is normal in configuration. No facet arthropathy. No uncovertebral joint disease. No neuroforaminal stenosis. No spinal canal stenosis.  C6-C7: Disc osteophyte complex. No facet arthropathy. No uncovertebral joint disease. No neuroforaminal stenosis. No spinal canal stenosis.  C7-T1: The disk is normal in configuration. Mild bilateral facet arthropathy. No uncovertebral joint disease. No neuroforaminal stenosis. No spinal canal stenosis.  No significant canal or  foraminal stenosis in the thoracic spine.  IMPRESSION: 1. 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 2. No significant canal or foraminal stenosis in the cervical or thoracic spine. 3. 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   Electronically Signed By: Clem Savory M.D. On: 04/20/2024 13:49   Narrative CLINICAL DATA:  Recent fall with headaches and neck pain, initial encounter  EXAM: CT HEAD WITHOUT CONTRAST  CT CERVICAL SPINE WITHOUT CONTRAST  TECHNIQUE: Multidetector CT imaging of the head and cervical spine was performed following the standard protocol without intravenous contrast. Multiplanar CT image reconstructions of the cervical spine were also generated.  RADIATION DOSE REDUCTION: This exam was performed according to the departmental dose-optimization program which includes automated exposure control, adjustment of the mA and/or kV according to patient size and/or use of iterative reconstruction technique.  COMPARISON:  04/12/2024  FINDINGS: CT HEAD FINDINGS  Brain: Heavily calcified lesion is noted arising from the left petrous ridge stable in appearance from the prior exam consistent with meningioma. No acute hemorrhage or acute infarction is seen.  Vascular: No hyperdense vessel or unexpected calcification.  Skull: Postsurgical changes in the left occipital bone laterally.  Sinuses/Orbits: No acute finding.  Other: None  CT CERVICAL SPINE FINDINGS  Alignment: Mild straightening of the normal cervical lordosis is noted. This may be related to muscular spasm.  Skull base and vertebrae: 7 cervical segments are well visualized. Vertebral body height is well maintained. No acute fracture or acute facet abnormality is noted.  Soft tissues and spinal canal: Surrounding soft tissue structures are within normal  limits.  Upper chest: Visualized lung apices are unremarkable.  Other: None  IMPRESSION: CT of the head: Stable meningioma along the left petrous ridge. No acute abnormality noted.  CT of the cervical spine: No acute bony abnormality noted.  Mild straightening of the normal cervical  lordosis which may be related to muscular spasm.   Electronically Signed By: Oneil Devonshire M.D. On: 05/17/2024 23:58   DG Cervical Spine 2-3 Views  Narrative CLINICAL DATA:  Pain with radiculopathy.  EXAM: CERVICAL SPINE - 2-3 VIEW  COMPARISON:  None.  FINDINGS: There is no evidence of cervical spine fracture or prevertebral soft tissue swelling. Alignment is normal. No other significant bone abnormalities are identified.  IMPRESSION: Negative cervical spine radiographs.   Electronically Signed By: Waddell Calk M.D. On: 02/15/2021 09:10  DG Shoulder Left  Narrative CLINICAL DATA:  Complains of pain in neck radiating into left arm and hand intermittently.  EXAM: LEFT SHOULDER - 2+ VIEW  COMPARISON:  None.  FINDINGS: There is no evidence of fracture or dislocation. There is no evidence of arthropathy or other focal bone abnormality. Soft tissues are unremarkable.  IMPRESSION: Negative.   Electronically Signed By: Waddell Calk M.D. On: 02/15/2021 09:07    Narrative CLINICAL DATA:  Prior meningioma resection, pain  EXAM: MRI CERVICAL SPINE WITHOUT AND WITH CONTRAST; MRI of the thoracic spine without and with intravenous contrast  TECHNIQUE: Multiplanar and multiecho pulse sequences of the cervical spine, to include the craniocervical junction and cervicothoracic junction, were obtained without and with intravenous contrast. Multiplanar multi weighted MRI of the thoracic spine without and with intravenous contrast  CONTRAST:  10mL GADAVIST  GADOBUTROL  1 MMOL/ML IV SOLN  COMPARISON:  MRI of the cervical spine dated 06/11/2021  FINDINGS: Alignment:  Physiologic.  Vertebrae: Vertebral bodies demonstrate normal signal intensity. No acute fracture is identified.  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 Cystic dilatation of the cord at the level of C6 measures up to 0.4 cm in greatest axial dimension, previously up to 0.9 cm.  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 See dedicated MRI of the brain.  Disc levels:  C2-C3: The disk is normal in configuration. No facet arthropathy. No uncovertebral joint disease. No neuroforaminal stenosis. No spinal canal stenosis.  C3-C4: The disk is normal in configuration. Mild left facet arthropathy. Mild bilateral uncovertebral joint disease. Mild left neuroforaminal stenosis. No spinal canal stenosis.  C4-C5: Disc osteophyte complex. No facet arthropathy. No uncovertebral joint disease. No neuroforaminal stenosis. No spinal canal stenosis.  C5-C6: The disk is normal in configuration. No facet arthropathy. No uncovertebral joint disease. No neuroforaminal stenosis. No spinal canal stenosis.  C6-C7: Disc osteophyte complex. No facet arthropathy. No uncovertebral joint disease. No neuroforaminal stenosis. No spinal canal stenosis.  C7-T1: The disk is normal in configuration. Mild bilateral facet arthropathy. No uncovertebral joint disease. No neuroforaminal stenosis. No spinal canal stenosis.  No significant canal or foraminal stenosis in the thoracic spine.  IMPRESSION: 1. 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 2. No significant canal or foraminal stenosis in the cervical or thoracic spine. 3. 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   Electronically Signed By: Clem Savory M.D. On: 04/20/2024 13:49   DG  Foot Complete Left  Narrative CLINICAL DATA:  Hit foot against solid object  EXAM: LEFT FOOT - COMPLETE 3+ VIEW  COMPARISON:  None.  FINDINGS: Frontal, oblique, and lateral views were obtained. There is a mildly comminuted fracture of the proximal aspect of the fifth middle phalanx with fracture fragments  in near anatomic alignment. No other fractures are evident. No dislocation. Joint spaces appear normal. No erosive change.  IMPRESSION: Mildly comminuted fracture proximal aspect fifth middle phalanx with fracture fragments in near anatomic alignment. No other fracture. No dislocation. No evident arthropathy.   Electronically Signed By: Elsie Repine III M.D. On: 09/13/2018 18:50    Complexity Note: Imaging results reviewed.                         ROS  Cardiovascular: High blood pressure Pulmonary or Respiratory: No reported pulmonary signs or symptoms such as wheezing and difficulty taking a deep full breath (Asthma), difficulty blowing air out (Emphysema), coughing up mucus (Bronchitis), persistent dry cough, or temporary stoppage of breathing during sleep Neurological: No reported neurological signs or symptoms such as seizures, abnormal skin sensations, urinary and/or fecal incontinence, being born with an abnormal open spine and/or a tethered spinal cord Psychological-Psychiatric: Anxiousness Gastrointestinal: No reported gastrointestinal signs or symptoms such as vomiting or evacuating blood, reflux, heartburn, alternating episodes of diarrhea and constipation, inflamed or scarred liver, or pancreas or irrregular and/or infrequent bowel movements Genitourinary: No reported renal or genitourinary signs or symptoms such as difficulty voiding or producing urine, peeing blood, non-functioning kidney, kidney stones, difficulty emptying the bladder, difficulty controlling the flow of urine, or chronic kidney disease Hematological: No reported hematological signs or  symptoms such as prolonged bleeding, low or poor functioning platelets, bruising or bleeding easily, hereditary bleeding problems, low energy levels due to low hemoglobin or being anemic Endocrine: No reported endocrine signs or symptoms such as high or low blood sugar, rapid heart rate due to high thyroid  levels, obesity or weight gain due to slow thyroid  or thyroid  disease Rheumatologic: No reported rheumatological signs and symptoms such as fatigue, joint pain, tenderness, swelling, redness, heat, stiffness, decreased range of motion, with or without associated rash Musculoskeletal: Negative for myasthenia gravis, muscular dystrophy, multiple sclerosis or malignant hyperthermia Work History: Disabled  Allergies  Lisa Hurst is allergic to other, shellfish allergy, apple, apple juice, and erythromycin.  Laboratory Chemistry Profile   Renal Lab Results  Component Value Date   BUN 15 09/19/2024   CREATININE 0.78 09/19/2024   GFRAA >60 11/17/2019   GFRNONAA >60 09/19/2024   PROTEINUR NEGATIVE 06/17/2023     Electrolytes Lab Results  Component Value Date   NA 138 09/19/2024   K 4.3 09/19/2024   CL 97 (L) 09/19/2024   CALCIUM 9.3 09/19/2024     Hepatic Lab Results  Component Value Date   AST 22 09/19/2024   ALT 29 09/19/2024   ALBUMIN 4.3 09/19/2024   ALKPHOS 98 09/19/2024   LIPASE 46 11/05/2021     ID Lab Results  Component Value Date   SARSCOV2NAA NEGATIVE 09/19/2024   PREGTESTUR NEGATIVE 01/06/2021     Bone No results found for: VD25OH, CI874NY7UNU, CI6874NY7, CI7874NY7, 25OHVITD1, 25OHVITD2, 25OHVITD3, TESTOFREE, TESTOSTERONE   Endocrine Lab Results  Component Value Date   GLUCOSE 96 09/19/2024   GLUCOSEU NEGATIVE 06/17/2023   HGBA1C 6.3 (A) 06/21/2024   TSH 0.920 11/17/2019   FREET4 1.05 11/17/2019     Neuropathy Lab Results  Component Value Date   HGBA1C 6.3 (A) 06/21/2024     CNS No results found for: COLORCSF, APPEARCSF,  RBCCOUNTCSF, WBCCSF, POLYSCSF, LYMPHSCSF, EOSCSF, PROTEINCSF, GLUCCSF, JCVIRUS, CSFOLI, IGGCSF, LABACHR, ACETBL   Inflammation (CRP: Acute  ESR: Chronic) No results found for: CRP, ESRSEDRATE, LATICACIDVEN   Rheumatology No results found for:  RF, ANA, LABURIC, URICUR, LYMEIGGIGMAB, LYMEABIGMQN, HLAB27   Coagulation Lab Results  Component Value Date   INR 1.0 04/12/2024   LABPROT 13.9 04/12/2024   APTT 31 04/12/2024   PLT 326 09/19/2024   DDIMER 0.75 (H) 06/17/2023     Cardiovascular Lab Results  Component Value Date   BNP 21.6 06/17/2023   CKTOTAL 136 06/17/2023   HGB 11.4 (L) 09/19/2024   HCT 35.8 (L) 09/19/2024     Screening Lab Results  Component Value Date   SARSCOV2NAA NEGATIVE 09/19/2024   PREGTESTUR NEGATIVE 01/06/2021     Cancer No results found for: CEA, CA125, LABCA2   Allergens No results found for: ALMOND, APPLE, ASPARAGUS, AVOCADO, BANANA, BARLEY, BASIL, BAYLEAF, GREENBEAN, LIMABEAN, WHITEBEAN, BEEFIGE, REDBEET, BLUEBERRY, BROCCOLI, CABBAGE, MELON, CARROT, CASEIN, CASHEWNUT, CAULIFLOWER, CELERY     Note: Lab results reviewed.  PFSH  Drug: Lisa Hurst  reports that she does not currently use drugs after having used the following drugs: Marijuana. Alcohol:  reports that she does not currently use alcohol. Tobacco:  reports that she has quit smoking. Her smoking use included cigarettes. She has a 4.5 pack-year smoking history. She has been exposed to tobacco smoke. She has never used smokeless tobacco. Medical:  has a past medical history of Allergy (7/17), Anemia, Anxiety, Depression, Heart murmur (07/17), Hypertension, Neuromuscular disorder (HCC) (01/23), and Occipital neuralgia. Family: family history includes Anxiety disorder in her mother, sister, sister, and sister; COPD in her mother; Cancer in her maternal aunt, maternal aunt, and maternal aunt; Depression in her  sister, sister, and sister; Obesity in her sister, sister, and sister; Varicose Veins in her mother.  Past Surgical History:  Procedure Laterality Date   BRAIN SURGERY  2022   brain tumor removed   CESAREAN SECTION  03/17/10   COLONOSCOPY WITH PROPOFOL  N/A 01/06/2021   Procedure: COLONOSCOPY WITH PROPOFOL ;  Surgeon: Therisa Bi, MD;  Location: [facility] ENDOSCOPY;  Service: Gastroenterology;  Laterality: N/A;   DILATION AND CURETTAGE OF UTERUS     GASTROSTOMY TUBE PLACEMENT  10/13/2021   HERNIA REPAIR     Active Ambulatory Problems    Diagnosis Date Noted   Hypertension 06/24/2020   Heart palpitations 11/29/2019   SOB (shortness of breath) on exertion 11/29/2019   Anxiety 04/09/2024   Chronic pain syndrome 04/09/2024   Knee pain 04/09/2024   Endotracheally intubated 10/11/2021   Hypomagnesemia 10/11/2021   Menopausal symptoms 09/21/2023   Postoperative anemia 10/11/2021   S/P resection of meningioma 10/11/2021   Borderline diabetes 06/21/2024   Resolved Ambulatory Problems    Diagnosis Date Noted   Chest pain with low risk for cardiac etiology 11/29/2019   Past Medical History:  Diagnosis Date   Allergy 7/17   Anemia    Depression    Heart murmur 07/17   Neuromuscular disorder (HCC) 01/23   Occipital neuralgia    Constitutional Exam  General appearance: Well nourished, well developed, and well hydrated. In no apparent acute distress Vitals:   10/17/24 1337  BP: 130/79  Pulse: 97  Resp: 16  Temp: 98.1 F (36.7 C)  TempSrc: Temporal  SpO2: 95%  Weight: 240 lb (108.9 kg)  Height: 5' 4 (1.626 m)   BMI Assessment: Estimated body mass index is 41.2 kg/m as calculated from the following:   Height as of this encounter: 5' 4 (1.626 m).   Weight as of this encounter: 240 lb (108.9 kg).  BMI interpretation table: BMI level Category Range association with higher incidence of chronic pain  <  18 kg/m2 Underweight   18.5-24.9 kg/m2 Ideal body weight   25-29.9 kg/m2  Overweight Increased incidence by 20%  30-34.9 kg/m2 Obese (Class I) Increased incidence by 68%  35-39.9 kg/m2 Severe obesity (Class II) Increased incidence by 136%  >40 kg/m2 Extreme obesity (Class III) Increased incidence by 254%   Patient's current BMI Ideal Body weight  Body mass index is 41.2 kg/m. Ideal body weight: 54.7 kg (120 lb 9.5 oz) Adjusted ideal body weight: 76.4 kg (168 lb 5.7 oz)   BMI Readings from Last 4 Encounters:  10/17/24 41.20 kg/m  09/19/24 41.20 kg/m  07/21/24 41.20 kg/m  06/21/24 41.37 kg/m   Wt Readings from Last 4 Encounters:  10/17/24 240 lb (108.9 kg)  09/19/24 240 lb (108.9 kg)  07/21/24 240 lb (108.9 kg)  06/21/24 241 lb (109.3 kg)    Psych/Mental status: Alert, oriented x 3 (person, place, & time)       Eyes: PERLA Respiratory: No evidence of acute respiratory distress  Cervical Spine Area Exam  Skin & Axial Inspection: Well healed scar from previous spine surgery detected left posterior auricular Alignment: Symmetrical Functional ROM: Pain restricted ROM, to the left Stability: No instability detected Muscle Tone/Strength: Functionally intact. No obvious neuro-muscular anomalies detected. Sensory (Neurological): Neurogenic pain pattern Palpation: Complains of area being tender to palpation              Allodynia noted along the left posterior auricular incision site Upper Extremity (UE) Exam    Side: Right upper extremity  Side: Left upper extremity  Skin & Extremity Inspection: Skin color, temperature, and hair growth are WNL. No peripheral edema or cyanosis. No masses, redness, swelling, asymmetry, or associated skin lesions. No contractures.  Skin & Extremity Inspection: Skin color, temperature, and hair growth are WNL. No peripheral edema or cyanosis. No masses, redness, swelling, asymmetry, or associated skin lesions. No contractures.  Functional ROM: Unrestricted ROM          Functional ROM: Pain restricted ROM for all joints of  upper extremity  Muscle Tone/Strength: Functionally intact. No obvious neuro-muscular anomalies detected.  Muscle Tone/Strength: Movement possible against some resistance (4/5) for bicep flexion, tricep extension, wrist flexion, wrist extension  Sensory (Neurological): Unimpaired          Sensory (Neurological): Neurogenic pain pattern          Palpation: No palpable anomalies              Palpation: Complains of area being tender to palpation              Provocative Test(s):  Phalen's test: deferred Tinel's test: deferred Apley's scratch test (touch opposite shoulder):  Action 1 (Across chest): deferred Action 2 (Overhead): deferred Action 3 (LB reach): deferred   Provocative Test(s):  Phalen's test: deferred Tinel's test: deferred Apley's scratch test (touch opposite shoulder):  Action 1 (Across chest): Decreased ROM Action 2 (Overhead): Decreased ROM Action 3 (LB reach): Decreased ROM     Assessment  Primary Diagnosis & Pertinent Problem List: 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 Diagnoses of S/P resection of meningioma, Neuropathic pain, arm  (left), Trapezius muscle strain, left, sequela, and Chronic pain syndrome were also pertinent to this visit.  Visit Diagnosis (New problems to examiner): 1. Brachial plexopathy (left)   2. S/P resection of meningioma   3. Neuropathic pain, arm  (left)   4. Trapezius muscle strain, left, sequela   5. 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 Her pain is described as constant, burning, and deep, localized behind the left ear and extending into the lateral neck and trapezius region,  with marked allodynia to touch. She has failed extensive conservative and interventional management including multiple left-sided occipital nerve blocks, Botox injections, and multimodal pharmacotherapy (methadone, oxycodone , pregabalin, cyclobenzaprine , amitriptyline), with only partial relief. Examination is notable for reduced motor function of the left upper extremity, raising concern for a central and/or peripheral neuropathic pain generator involving cervical nerve roots or the brachial plexus in the setting of prior surgery and radiation.  Given the refractory nature of her symptoms, the focal distribution of pain involving the lateral neck/retroauricular region, and the associated neurologic deficits, we discussed proceeding with a diagnostic left brachial plexus block via an interscalene approach under ultrasound guidance. The purpose of this injection is diagnostic rather than therapeutic, to help determine whether a significant component of her pain is mediated by the proximal brachial plexus/cervical nerve roots. A meaningful short-term reduction in pain following local anesthetic would support a peripheral pain generator amenable to targeted neuromodulation.  If the diagnostic block provides significant temporary relief, she may be an appropriate candidate for peripheral nerve stimulation (PNS) targeting the brachial plexus as a non-destructive neuromodulation option for long-term management of her neuropathic pain. Risks, benefits, and limitations of PNS were discussed, including infection, lead migration, nerve irritation, incomplete relief, and the temporary nature of the SPRINT system.  We also discussed additional future diagnostic and therapeutic options depending on her response, including:  Diagnostic cervical medial branch nerve blocks to evaluate for concomitant facet-mediated cervical pain  Peripheral nerve stimulation of the cervical medial branch nerves if facetogenic pain is  confirmed  Spinal cord stimulation as a more global neuromodulation strategy for refractory unilateral neuropathic pain, particularly given her post-surgical and post-radiation central sensitization  The patient was counseled extensively regarding the goals of diagnostic injections, expected duration of anesthetic effect, and how results guide long-term treatment planning. Risks of the interscalene block were reviewed, including bleeding, infection, pneumothorax, vascular injection, transient diaphragmatic weakness, hoarseness, nerve injury, and temporary upper extremity weakness. Given her baseline neurologic deficits, particular care will be taken with technique and volume.  She voiced understanding and wishes to proceed with the diagnostic interscalene brachial plexus block.  Procedure Orders         BRACHIAL PLEXUS BLOCK     Interventional management options: Lisa Hurst was informed that there is no guarantee that she would be a candidate for interventional therapies. The decision will be based on the results of diagnostic studies, as well as Lisa Hurst's risk profile.  Procedure(s) under consideration:  Interscalene block- brachial plexus Cervical facet blocks SCS    Provider-requested follow-up: Return in about 13 days (around 10/30/2024) for left scalene (brachial plexus block) , in clinic IV Versed  (block 30 mins).  Future Appointments  Date Time Provider Department Center  01/16/2025  9:00 AM Jackquline Sawyer, MD [facility] None   I discussed the assessment and treatment plan with the patient. The patient was provided an opportunity to ask questions and all were answered. The patient agreed with the plan and demonstrated an understanding of the instructions.  Patient advised to call back or seek an in-person evaluation if the symptoms or  condition worsens.  I personally spent a total of 60 minutes in the care of the patient today including preparing to see the patient, getting/reviewing  separately obtained history, performing a medically appropriate exam/evaluation, counseling and educating, placing orders, and documenting clinical information in the EHR.   Note by: Wallie Sherry, MD (TTS and AI technology used. I apologize for any typographical errors that were not detected and corrected.) Date: 10/17/2024; Time: 4:20 PM     [1]  Current Outpatient Medications:    amitriptyline (ELAVIL) 100 MG tablet, Take 100 mg by mouth at bedtime., Disp: , Rfl:    Azelaic Acid  15 % gel, APPLY TOPICALLY TO FACE 1-2 TIMES (EVERY MORNING) FOR ACNE, Disp: 50 g, Rfl: 6   carvedilol  (COREG ) 6.25 MG tablet, TAKE 1 TABLET BY MOUTH 2 TIMES DAILY WITH A MEAL., Disp: 180 tablet, Rfl: 1   cyclobenzaprine  (FLEXERIL ) 5 MG tablet, Take 1-2 tablets (5-10 mg total) by mouth 3 (three) times daily as needed., Disp: 30 tablet, Rfl: 1   escitalopram  (LEXAPRO ) 20 MG tablet, TAKE 1 TABLET BY MOUTH EVERY DAY, Disp: 90 tablet, Rfl: 1   hydrochlorothiazide  (HYDRODIURIL ) 12.5 MG tablet, Take 1 tablet (12.5 mg total) by mouth daily., Disp: 30 tablet, Rfl: 3   losartan (COZAAR) 100 MG tablet, Take 100 mg by mouth daily., Disp: , Rfl:    meclizine  (ANTIVERT ) 25 MG tablet, Take 1 tablet (25 mg total) by mouth 3 (three) times daily as needed for dizziness., Disp: 15 tablet, Rfl: 0   meclizine  (ANTIVERT ) 25 MG tablet, Take 1 tablet (25 mg total) by mouth 3 (three) times daily as needed., Disp: 30 tablet, Rfl: 1   metFORMIN  (GLUCOPHAGE -XR) 500 MG 24 hr tablet, Take 1 tablet (500 mg total) by mouth daily with breakfast., Disp: 90 tablet, Rfl: 1   methadone (DOLOPHINE) 5 MG tablet, Take 5 mg by mouth at bedtime., Disp: , Rfl:    metoCLOPramide  (REGLAN ) 10 MG tablet, Take 1 tablet (10 mg total) by mouth every 8 (eight) hours as needed for nausea., Disp: 30 tablet, Rfl: 1   pregabalin (LYRICA) 100 MG capsule, Take 100 mg by mouth 2 (two) times daily., Disp: , Rfl:    tretinoin  (ALTRALIN) 0.05 % gel, Apply topically to face qam  for acne. If flared can use twice daily., Disp: 45 g, Rfl: 5   White Petrolatum-Mineral Oil (SYSTANE NIGHTTIME) OINT, Apply 1 application to eye 4 (four) times daily as needed., Disp: , Rfl:

## 2024-10-17 NOTE — Patient Instructions (Addendum)

## 2024-10-17 NOTE — Progress Notes (Signed)
 Safety precautions to be maintained throughout the outpatient stay will include: orient to surroundings, keep bed in low position, maintain call bell within reach at all times, provide assistance with transfer out of bed and ambulation.

## 2024-11-01 ENCOUNTER — Ambulatory Visit: Admitting: Student in an Organized Health Care Education/Training Program

## 2024-11-01 ENCOUNTER — Ambulatory Visit: Admitting: Family Medicine

## 2024-11-01 ENCOUNTER — Encounter: Payer: Self-pay | Admitting: Family Medicine

## 2024-11-01 VITALS — BP 151/91 | HR 81 | Temp 97.9°F | Resp 16 | Ht 64.0 in | Wt 236.2 lb

## 2024-11-01 DIAGNOSIS — B351 Tinea unguium: Secondary | ICD-10-CM | POA: Insufficient documentation

## 2024-11-01 DIAGNOSIS — I10 Essential (primary) hypertension: Secondary | ICD-10-CM

## 2024-11-01 DIAGNOSIS — M542 Cervicalgia: Secondary | ICD-10-CM | POA: Diagnosis not present

## 2024-11-01 MED ORDER — CICLOPIROX 8 % EX SOLN: Freq: Every day | CUTANEOUS | 1 refills | Status: AC

## 2024-11-01 MED ORDER — MELOXICAM 15 MG PO TABS: 15.0000 mg | ORAL_TABLET | Freq: Every day | ORAL | 1 refills | Status: AC

## 2024-11-01 MED ORDER — LOSARTAN POTASSIUM 100 MG PO TABS: 100.0000 mg | ORAL_TABLET | Freq: Every day | ORAL | 1 refills | Status: AC

## 2024-11-01 NOTE — Progress Notes (Signed)
 "  Established Patient Office Visit  Subjective   Patient ID: Lisa Hurst, female    DOB: 03-28-1971  Age: 54 y.o. MRN: [id number]  Chief Complaint  Patient presents with   Medical Management of Chronic Issues    Follow up.    Dry eyes    Requesting an rx for dry eyes.    Pain    Left ear pain right behind her ear. Radiates to half of back side of head and towards upper left side of hear. Burning sensation. The pain also radiates down to left shoulder. It feels dislocated.    Rash    Feet have rash or fungus. Feet are discolored.     Rash   Discussed the use of AI scribe software for clinical note transcription with the patient, who gave verbal consent to proceed.  History of Present Illness   Lisa Hurst is a 54 year old female who presents with black toenails and ongoing pain.  She has black toenails, which she attributes to a fungal infection. Despite applying moisture, she continues to experience rough skin. She is interested in trying a topical treatment called Penlac  for the fungal infection.  She completed radiation therapy to her brain, which she reports stopped the brain growth but did not alleviate her pain. She experiences head pain and brittle teeth, which she associates with her treatment. She plans to visit a dentist for her dental issues.  She is currently taking Coreg  6.25 mg, HCTZ 12 mg, and losartan  100mg  daily for blood pressure and metformin  500 XR once daily for blood sugar management. She acknowledges not checking her blood pressure at home recently. Her A1c is 6.3%, indicating prediabetes, and she continues to take metformin  without gastrointestinal issues.  She just started divalproex sodium 125 mg twice daily for seizures, which causes side effects such as facial twitching and dizziness if not taken with food. She describes episodes of violent shaking and falling, which are being investigated with EEGs to rule out seizures.  The shaking always occurs  when she gets out of her car and tries to walk up to the house or when she gets up to void and walks through the house.    She experiences dizziness upon standing and has learned to stand still to prevent falls. She reports episodes of shaking when getting out of the car and walking to the house, which subside when she sits down. She is trying to increase her water intake to manage these symptoms.  She has a history of severe pain following neurosurgery, which has persisted for three years. She has tried various treatments, including amitriptyline and nutritional supplements, without relief. She is currently taking Lexapro  and Lyrica for pain management, with Lyrica at a dose of 200 mg three times daily.  She is not taking amitriptyline any longer.     [name] is a 54 year old female who presents with black toenails and ongoing pain.  She has black toenails, which she attributes to a fungal infection. Despite applying moisture, she continues to experience rough skin. She is interested in trying a topical treatment called Penlac  for the fungal infection.  She completed radiation therapy, which she reports stopped the growth but did not alleviate her pain. She experiences head pain and brittle teeth, which she associates with her treatment. She plans to visit a dentist for her dental issues.  She is currently taking Coreg  6.25 mg, HCTZ 12 mg, and metformin  500 mg daily for blood  pressure and blood sugar management. She acknowledges not checking her blood pressure at home recently. Her A1c is 6.3, indicating prediabetes, and she continues to take metformin  without gastrointestinal issues.  She is on divalproex sodium 125 mg twice daily for seizures, which causes side effects such as facial twitching and dizziness if not taken with food. She describes episodes of violent shaking and falling, which are being investigated with EEGs to rule out seizures.  She experiences dizziness upon standing and  has learned to stand still to prevent falls. She reports episodes of shaking when getting out of the car and walking to the house, which subside when she sits down. She is trying to increase her water intake to manage these symptoms.  She has a history of severe pain following neurosurgery, which has persisted for three years. She has tried various treatments, including amitriptyline and nutritional supplements, without relief. She is currently taking Lexapro  and Lyrica for pain management, with Lyrica at a dose of 200 mg three times daily.       Objective:     BP (!) 151/91   Pulse 81   Temp 97.9 F (36.6 C) (Oral)   Resp 16   Ht 5' 4 (1.626 m)   Wt 236 lb 3.2 oz (107.1 kg)   LMP 02/15/2021   SpO2 93%   BMI 40.54 kg/m    Physical Exam Vitals and nursing note reviewed.  Constitutional:      Appearance: Normal appearance.  HENT:     Head: Normocephalic and atraumatic.  Eyes:     Conjunctiva/sclera: Conjunctivae normal.  Cardiovascular:     Rate and Rhythm: Normal rate and regular rhythm.  Pulmonary:     Effort: Pulmonary effort is normal.     Breath sounds: Normal breath sounds.  Musculoskeletal:     Right lower leg: No edema.     Left lower leg: No edema.  Skin:    General: Skin is warm and dry.  Neurological:     Mental Status: She is alert and oriented to person, place, and time.  Psychiatric:        Mood and Affect: Mood normal.        Behavior: Behavior normal.        Thought Content: Thought content normal.        Judgment: Judgment normal.          No results found for any visits on 11/01/24.    The ASCVD Risk score (Arnett DK, et al., 2019) failed to calculate for the following reasons:   Cannot find a previous HDL lab   Cannot find a previous total cholesterol lab   * - Cholesterol units were assumed    Assessment & Plan:  Neck pain -     Meloxicam ; Take 1 tablet (15 mg total) by mouth daily.  Dispense: 90 tablet; Refill: 1  Primary  hypertension -     Losartan  Potassium; Take 1 tablet (100 mg total) by mouth daily.  Dispense: 90 tablet; Refill: 1  Onychomycosis Assessment & Plan: Penlac .  Apply daily and remove with alcohol once a week,    Orders: -     Ciclopirox ; Apply topically at bedtime. Apply over nail and surrounding skin. Apply daily over previous coat. After seven (7) days, may remove with alcohol and continue cycle.  Dispense: 6.6 mL; Refill: 1     Return in about 3 months (around 01/30/2025) for BP recheck.    Yanis Larin K Shadee Montoya, MD "

## 2024-11-01 NOTE — Assessment & Plan Note (Signed)
 Penlac .  Apply daily and remove with alcohol once a week,

## 2025-01-16 ENCOUNTER — Ambulatory Visit: Admitting: Dermatology
